# Patient Record
Sex: Female | Born: 1935 | Race: White | Hispanic: No | Smoking: Never smoker
Health system: Southern US, Community
[De-identification: ages and names within clinical notes are randomized; demographics above are authoritative.]

## PROBLEM LIST (undated history)

## (undated) DIAGNOSIS — I6789 Other cerebrovascular disease: Secondary | ICD-10-CM

## (undated) DIAGNOSIS — M1990 Unspecified osteoarthritis, unspecified site: Secondary | ICD-10-CM

## (undated) DIAGNOSIS — I10 Essential (primary) hypertension: Secondary | ICD-10-CM

## (undated) DIAGNOSIS — N390 Urinary tract infection, site not specified: Secondary | ICD-10-CM

## (undated) DIAGNOSIS — R339 Retention of urine, unspecified: Secondary | ICD-10-CM

## (undated) DIAGNOSIS — C801 Malignant (primary) neoplasm, unspecified: Secondary | ICD-10-CM

## (undated) DIAGNOSIS — I639 Cerebral infarction, unspecified: Secondary | ICD-10-CM

## (undated) HISTORY — PX: ABDOMINAL HYSTERECTOMY: SHX81

## (undated) HISTORY — PX: APPENDECTOMY: SHX54

## (undated) HISTORY — DX: Retention of urine, unspecified: R33.9

## (undated) HISTORY — DX: Other cerebrovascular disease: I67.89

## (undated) HISTORY — PX: TONSILLECTOMY: SUR1361

## (undated) HISTORY — DX: Urinary tract infection, site not specified: N39.0

---

## 2000-10-22 ENCOUNTER — Other Ambulatory Visit: Admission: RE | Admit: 2000-10-22 | Discharge: 2000-10-22 | Payer: Self-pay | Admitting: Dermatology

## 2001-04-25 ENCOUNTER — Encounter: Payer: Self-pay | Admitting: Family Medicine

## 2001-04-25 ENCOUNTER — Ambulatory Visit (HOSPITAL_COMMUNITY): Admission: RE | Admit: 2001-04-25 | Discharge: 2001-04-25 | Payer: Self-pay | Admitting: Family Medicine

## 2008-12-22 ENCOUNTER — Ambulatory Visit: Payer: Self-pay | Admitting: Gastroenterology

## 2008-12-22 ENCOUNTER — Inpatient Hospital Stay (HOSPITAL_COMMUNITY): Admission: EM | Admit: 2008-12-22 | Discharge: 2008-12-24 | Payer: Self-pay | Admitting: Emergency Medicine

## 2008-12-23 ENCOUNTER — Ambulatory Visit: Payer: Self-pay | Admitting: Internal Medicine

## 2008-12-30 ENCOUNTER — Telehealth: Payer: Self-pay | Admitting: Gastroenterology

## 2009-01-01 ENCOUNTER — Encounter: Payer: Self-pay | Admitting: Internal Medicine

## 2009-01-05 ENCOUNTER — Encounter: Payer: Self-pay | Admitting: Gastroenterology

## 2009-01-20 ENCOUNTER — Encounter: Payer: Self-pay | Admitting: Gastroenterology

## 2009-01-20 ENCOUNTER — Ambulatory Visit: Payer: Self-pay | Admitting: Gastroenterology

## 2009-01-20 ENCOUNTER — Ambulatory Visit (HOSPITAL_COMMUNITY): Admission: RE | Admit: 2009-01-20 | Discharge: 2009-01-20 | Payer: Self-pay | Admitting: Gastroenterology

## 2009-01-28 ENCOUNTER — Encounter: Payer: Self-pay | Admitting: Gastroenterology

## 2010-08-13 LAB — URINALYSIS, ROUTINE W REFLEX MICROSCOPIC
Bilirubin Urine: NEGATIVE
Nitrite: NEGATIVE
Protein, ur: NEGATIVE mg/dL

## 2010-08-13 LAB — CBC
HCT: 33 % — ABNORMAL LOW (ref 36.0–46.0)
HCT: 34.4 % — ABNORMAL LOW (ref 36.0–46.0)
HCT: 35.6 % — ABNORMAL LOW (ref 36.0–46.0)
HCT: 38.4 % (ref 36.0–46.0)
Hemoglobin: 12.3 g/dL (ref 12.0–15.0)
Hemoglobin: 13.2 g/dL (ref 12.0–15.0)
MCHC: 34.4 g/dL (ref 30.0–36.0)
MCHC: 34.8 g/dL (ref 30.0–36.0)
MCV: 95.7 fL (ref 78.0–100.0)
MCV: 96.4 fL (ref 78.0–100.0)
MCV: 96.6 fL (ref 78.0–100.0)
Platelets: 152 10*3/uL (ref 150–400)
Platelets: 163 10*3/uL (ref 150–400)
Platelets: 183 10*3/uL (ref 150–400)
Platelets: 206 10*3/uL (ref 150–400)
RBC: 3.98 MIL/uL (ref 3.87–5.11)
RDW: 13.9 % (ref 11.5–15.5)
RDW: 14.1 % (ref 11.5–15.5)
RDW: 14.3 % (ref 11.5–15.5)
WBC: 11.5 10*3/uL — ABNORMAL HIGH (ref 4.0–10.5)
WBC: 11.6 10*3/uL — ABNORMAL HIGH (ref 4.0–10.5)
WBC: 14.3 10*3/uL — ABNORMAL HIGH (ref 4.0–10.5)
WBC: 15.3 10*3/uL — ABNORMAL HIGH (ref 4.0–10.5)

## 2010-08-13 LAB — DIFFERENTIAL
Basophils Absolute: 0 10*3/uL (ref 0.0–0.1)
Basophils Absolute: 0 10*3/uL (ref 0.0–0.1)
Basophils Absolute: 0 10*3/uL (ref 0.0–0.1)
Basophils Absolute: 0 10*3/uL (ref 0.0–0.1)
Basophils Relative: 0 % (ref 0–1)
Basophils Relative: 0 % (ref 0–1)
Eosinophils Absolute: 0 10*3/uL (ref 0.0–0.7)
Eosinophils Absolute: 0.1 10*3/uL (ref 0.0–0.7)
Eosinophils Absolute: 0.3 10*3/uL (ref 0.0–0.7)
Eosinophils Relative: 0 % (ref 0–5)
Eosinophils Relative: 3 % (ref 0–5)
Lymphocytes Relative: 4 % — ABNORMAL LOW (ref 12–46)
Lymphs Abs: 1.7 10*3/uL (ref 0.7–4.0)
Monocytes Absolute: 0.5 10*3/uL (ref 0.1–1.0)
Neutro Abs: 13 10*3/uL — ABNORMAL HIGH (ref 1.7–7.7)
Neutrophils Relative %: 74 % (ref 43–77)
Neutrophils Relative %: 83 % — ABNORMAL HIGH (ref 43–77)
Neutrophils Relative %: 92 % — ABNORMAL HIGH (ref 43–77)

## 2010-08-13 LAB — COMPREHENSIVE METABOLIC PANEL
ALT: 16 U/L (ref 0–35)
Albumin: 3.5 g/dL (ref 3.5–5.2)
Alkaline Phosphatase: 39 U/L (ref 39–117)
BUN: 16 mg/dL (ref 6–23)
CO2: 24 mEq/L (ref 19–32)
Calcium: 8.3 mg/dL — ABNORMAL LOW (ref 8.4–10.5)
Chloride: 106 mEq/L (ref 96–112)
Creatinine, Ser: 0.84 mg/dL (ref 0.4–1.2)
Creatinine, Ser: 1.09 mg/dL (ref 0.4–1.2)
GFR calc Af Amer: >60 mL/min (ref >60)
GFR calc non Af Amer: 49 mL/min — ABNORMAL LOW (ref >60)
GFR calc non Af Amer: >60 mL/min (ref >60)
Glucose, Bld: 114 mg/dL — ABNORMAL HIGH (ref 70–99)
Glucose, Bld: 114 mg/dL — ABNORMAL HIGH (ref 70–99)
Potassium: 3.8 mEq/L (ref 3.5–5.1)
Potassium: 3.9 mEq/L (ref 3.5–5.1)
Total Bilirubin: 0.5 mg/dL (ref 0.3–1.2)
Total Bilirubin: 0.6 mg/dL (ref 0.3–1.2)

## 2010-08-13 LAB — MAGNESIUM: Magnesium: 1.9 mg/dL (ref 1.5–2.5)

## 2010-08-13 LAB — CLOSTRIDIUM DIFFICILE EIA

## 2010-08-13 LAB — BASIC METABOLIC PANEL
BUN: 19 mg/dL (ref 6–23)
BUN: 5 mg/dL — ABNORMAL LOW (ref 6–23)
CO2: 26 mEq/L (ref 19–32)
CO2: 30 mEq/L (ref 19–32)
Calcium: 9.7 mg/dL (ref 8.4–10.5)
Chloride: 106 mEq/L (ref 96–112)
Chloride: 113 mEq/L — ABNORMAL HIGH (ref 96–112)
Creatinine, Ser: 0.91 mg/dL (ref 0.4–1.2)
Creatinine, Ser: 1.33 mg/dL — ABNORMAL HIGH (ref 0.4–1.2)
GFR calc Af Amer: 47 mL/min — ABNORMAL LOW (ref >60)
GFR calc non Af Amer: 39 mL/min — ABNORMAL LOW (ref >60)
Glucose, Bld: 105 mg/dL — ABNORMAL HIGH (ref 70–99)
Glucose, Bld: 118 mg/dL — ABNORMAL HIGH (ref 70–99)
Potassium: 3.9 mEq/L (ref 3.5–5.1)
Sodium: 139 mEq/L (ref 135–145)

## 2010-08-13 LAB — LACTIC ACID, PLASMA: Lactic Acid, Venous: 1.3 mmol/L (ref 0.5–2.2)

## 2010-08-13 LAB — APTT
aPTT: 29 seconds (ref 24–37)
aPTT: 34 seconds (ref 24–37)

## 2010-08-13 LAB — PHOSPHORUS: Phosphorus: 2.2 mg/dL — ABNORMAL LOW (ref 2.3–4.6)

## 2010-08-13 LAB — OVA AND PARASITE EXAMINATION

## 2010-08-13 LAB — PROTIME-INR
INR: 1 (ref 0.00–1.49)
Prothrombin Time: 13.2 seconds (ref 11.6–15.2)

## 2010-08-13 LAB — URINE MICROSCOPIC-ADD ON

## 2010-09-20 NOTE — H&P (Signed)
[name], [name]               ACCOUNT NO.:  [id number]   MEDICAL RECORD NO.:  [id number]          PATIENT TYPE:  INP   LOCATION:  A211                          FACILITY:  [facility]   PHYSICIAN:  Margaretmary Dys, M.D.DATE OF BIRTH:  1929/08/02   DATE OF ADMISSION:  12/21/2008  DATE OF DISCHARGE:  LH                              HISTORY & PHYSICAL   PRIMARY CARE PHYSICIAN:  Kirk Ruths, M.D.   ADMISSION DIAGNOSES:  1. Acute abdominal pain.  2. Gastrointestinal bleeding, also acute  3. Colitis ischemic versus infectious.  4. History of hypertension, currently stable.  5. Acute renal failure with elevated creatinine of 1.33, likely      secondary to prerenal azotemia.   CHIEF COMPLAINT:  Bloody diarrhea of 1 day duration.   HISTORY OF PRESENT ILLNESS:  [name] is a 76 year old female who  presented to the emergency room after having multiple episodes of gross  bloody diarrhea at home.  The patient apparently said she went out to  eat lunch with her sister today.  After she got home, she began to have  diarrheal stools.  Initially, they were mostly brownish but subsequently  had become bloody.  She says she also had gone to the bathroom several  times and she felt lightheaded occasionally when she stood up.   She did not, however, have a syncopal episode.  She has no prior history  of GI bleed.  She has never had a colonoscopy.  She has no recent weight  loss.  She had been taking MiraLax for several days due to constipation  over the past 3 months.  However, she quit using that about 2 weeks ago  after she got what appeared to be relief.  She also has some nausea but  has had no vomiting.  No fevers or chills.  She denies any headaches,  but did have some lightheadedness after having several bowel episodes.  She also describes some abdominal pain, mostly periumbilical in origin.  It appears to be crampy in nature and was 8/10 at its worst and  temporarily relieved  with the diarrhea.  The patient was evaluated in  the emergency room and she did actually have one bowel movement in the  emergency room which was bloody.  The patient however was  hemodynamically stable, her H and H was stable.  She is not requiring  any emergency transfusion and her blood pressures were also stable.  The  patient is now being admitted for further evaluation.  I had requested a  CT scan of abdomen and pelvis which shows evidence of colitis; however,  due to her borderline renal function, contrast was not administered.  The patient is going to be kept n.p.o. and will request gastroenterology  input in the morning.   REVIEW OF SYSTEMS:  A 10-point review of systems otherwise negative  except as mentioned in history of present illness.   PAST MEDICAL HISTORY:  Hypertension.   PAST SURGICAL HISTORY:  1. Cesarean section.  2. Status post hysterectomy.   MEDICATIONS:  1. Lisinopril 1 tablet daily.  2. Aspirin  81 mg at bedtime.  3. Multivitamin, a women's formula, once a day.  4. Omega 3 fish orally once a day.  5. MiraLax which she stopped using last week.   ALLERGIES:  She has no known drug allergies.   FAMILY HISTORY:  Noncontributory.   SOCIAL HISTORY:  The patient does not smoke, does not drink alcohol.  No  history of illicit drug abuse.  She is very independent of activities of  daily living and continues to be very active.   PHYSICAL EXAMINATION:  The patient was conscious, alert, comfortable,  not in acute distress, well-oriented in time, place and person.  VITAL SIGNS: Blood pressure was 128/69, respiratory rate of 20, pulse  92, oxygen saturation was 100% on 2 liters nasal cannula.  Temperature  was 98.6 degrees Fahrenheit.  HEENT: Exam normocephalic, atraumatic.  Oral mucosa was dry.  No  exudates were noted.  NECK:  Supple.  No JVD or lymphadenopathy.  LUNGS:  Clear clinically with good air entry bilaterally.  HEART:  S1-S2 regular.  No S3, S4,  gallops or rubs.  ABDOMEN:  Soft, nontender, slightly obese but was not distended.  There  was no guarding  or rigidity. Bowel sounds were present and slightly  hyperactive.  RECTAL:  Exam showed gross bloody stools.  A digital rectal exam was not  performed.  EXTREMITIES:  No pitting pedal edema.  No calf induration or tenderness  was noted.  CNS:  Exam was grossly intact with no focal neurological deficits.   LABORATORY AND DIAGNOSTIC DATA:  White blood cell count was 11.6,  hemoglobin of 13, hematocrit was 38, platelet count was 206, neutrophils  of 88%.  The sodium is 139, potassium is 3.9, chloride 106, CO2 was 30,  glucose 118, BUN is 19, creatinine was 1.33, calcium was 9.7, lactic  acid was 1.3.  Urinalysis shows high specific gravity.  Urine microscopy  shows granular casts, 7-10 wbc's and only a few bacteria.  The stool for  Clostridium difficile toxin and cultures are pending.  CT scan of the  abdomen and pelvis shows evidence of long segment of colitis involving  the distal transverse and all of the descending colon.  Differential is  possible infectious colitis, including possible see Clostridium  difficile diarrhea, ischemic colitis or inflammatory bowel disease.  The  patient has some colonic diverticulosis without any evidence of  diverticulitis.   ASSESSMENT:  A 77 year old female presented to the emergency room due to  1-day history of bloody diarrhea.  She is hemodynamically stable.  CT  scan is suggestive of a colitis of uncertain etiology;  however, based  on her age, I suspect the patient has ischemic colitis. The symptoms are  also somewhat suggestive.   PLAN:  1. The patient will be admitted to the medical floor.  2. Will institute IV fluid therapy with normal saline at 75 mL an      hour.  3. Will monitor her vital signs very closely for any evidence of      worsening hypotension to suggest ongoing bleed.  4. Will also monitor hemoglobin and hematocrit.   5. Will request Dr. Jena Gauss, the gastroenterologist, to see the patient      in consult.  The patient may require colonoscopy with possible      biopsies.  6. I will put the patient empirically on ciprofloxacin and      metronidazole.  7. Will place the patient on sequential compression devices for DVT  prophylaxis.  8. Will type and cross blood if the patient has any more episodes of      bleeding here although her H and H is stable with no indication for      blood transfusion at this time.  9. Lactic acid level was normal.  10.Will hold blood pressure medications at this time and also her      aspirin will be put on hold.  11.Will place the patient on GI prophylaxis with Protonix q.12 hours.   I have discussed the above plan with the patient and also the fact that  she will require gastroenterology consult.  I also indicated that she  may also require surgery depending on the severity of her colitis.  I  also informed her that she may need blood transfusion.  The patient  verbalized full understanding.      Margaretmary Dys, M.D.  Electronically Signed     AM/MEDQ  D:  12/22/2008  T:  12/22/2008  Job:  [id number]   cc:   Kirk Ruths, M.D.  Fax: [phone]

## 2010-09-20 NOTE — Consult Note (Signed)
[name], [name]               ACCOUNT NO.:  [id number]   MEDICAL RECORD NO.:  [id number]          PATIENT TYPE:  INP   LOCATION:  A211                          FACILITY:  [facility]   PHYSICIAN:  Kassie Mends, M.D.      DATE OF BIRTH:  [birth year]   DATE OF CONSULTATION:  12/22/2008  DATE OF DISCHARGE:                                 CONSULTATION   REFERRING PHYSICIAN:  Dr. Rito Ehrlich.   REASON FOR CONSULTATION:  Diarrhea and hematochezia.   HISTORY OF PRESENT ILLNESS:  [name] is a 77 year old female who was  in her usual state of health until 2:30 p.m. yesterday.  She ate at  [facility].  It was the buffet.  She did consume beef.  She had no  chicken, fish or seafood.  She also had ham.  She had some mushrooms  that did not really taste good.  She had the onset of diarrhea and then  had some blood on the tissue.  Then she had frank bright red blood.  She  had 5-6 episodes of bright red blood per rectum.  She continues to have  bright red blood per rectum today.  She has some mild lower abdominal  cramping.  She does have an urge to have a bowel movement and then blood  comes out.  Prior to yesterday, she was having a problem with  constipation due to calcium.  She had stopped the calcium approximately  1 week ago.  She also takes a half aspirin at nighttime and also Aleve  once in the morning for the past month.  She takes the Aleve for aches  and pains.  She stopped the ibuprofen due to bad breath.  She has  never ever had these symptoms before.  She denies any fever or chills.   PAST MEDICAL HISTORY:  Hypertension.   PAST SURGICAL HISTORY:  1. Cesarean section x4.  2. Hysterectomy due to heavy menstrual bleeding in the 1970s.   ALLERGIES:  No known drug allergies.   MEDICATIONS:  1. Cipro.  2. Flagyl.  3. Protonix.  4. Morphine as needed.  5. Zofran as needed.  6. Phenergan as needed.   MEDICATIONS AT HOME:  1. Lisinopril.  2. Aspirin.  3. Multivitamin.  4.  Omega-3 fish oil.   FAMILY HISTORY:  She denies any family history of colon cancer or colon  polyps.   SOCIAL HISTORY:  She does not smoke or drink alcohol.  She continues to  be very active.  She is inquiring as to when she might be able to go  home.   REVIEW OF SYSTEMS:  As per the HPI.  Otherwise, all systems were  negative.   PHYSICAL EXAMINATION:  VITAL SIGNS:  T-max 98.6, systolic blood pressure  121 to 93, respiratory rate 18, heart rate 90, O2 saturation 98% on 2 L.  GENERAL:  She is in no apparent distress, alert and oriented x4.HEENT:  Atraumatic, normocephalic.  Pupils equal and reactive to light.  Mouth:  No oral lesions.  Posterior pharynx without erythema or exudate.NECK:  Full range of motion,  no lymphadenopathy.LUNGS:  Clear to auscultation  bilaterally.CARDIOVASCULAR:  Regular rhythm with no murmur.  No S1 and  S2.ABDOMEN:  Bowel sounds are present, soft, nondistended,  nontender.EXTREMITIES:  No cyanosis or edema.  NEUROLOGIC:  She has no focal neurologic deficits.   LABS:  White count 11.6 to 14.3, hemoglobin 13.2 to 12.3, platelets 183.  Creatinine 1.33 to 1.09.  Normal liver enzymes.  Lactic acid 1.3.  UA  showed few bacteria and 7-10 wbc's.  She has a pending C. difficile and  O and P.   RADIOGRAPHIC STUDIES:  CT scan of the abdomen without contrast showed  mild inflammation in the transverse and descending colon.  I personally  reviewed the CT scan with Dr. Juan Quam.  She has sigmoid colon  diverticulosis.  The SMA and IMA axis are patent.  This study is  somewhat limited due to the lack of contrast, but no atherosclerotic  plaques are noted of the SMA or the IMA axis.   ASSESSMENT:  [name] is a 77 year old female with the sudden onset  of diarrhea and hematochezia, followed by mild abdominal discomfort.  The differential diagnosis includes infectious colitis, inflammatory  bowel disease, and a low likelihood of nonsteroidal anti-inflammatory   drug colitis or ischemic colitis.  Thank you for allowing me to see Ms.  Symonds in consultation.  My recommendations follow.   RECOMMENDATIONS:  1. Continue Cipro and Flagyl for 10 days.  2. Full liquid diet.  3. If the patient continues to improve, then schedule colonoscopy as      an outpatient.  If she continues to have rectal bleeding in spite      of antibiotics, then she will need a colonoscopy on Thursday.   ADDENDUM 21308:  TCS 91510: Hyperplastic polyp. Repeat in 10 years.      Kassie Mends, M.D.  Electronically Signed     SM/MEDQ  D:  12/23/2008  T:  12/23/2008  Job:  [id number]   cc:   Kirk Ruths, M.D.  Fax: [phone]

## 2010-09-20 NOTE — Discharge Summary (Signed)
[name], [name]               ACCOUNT NO.:  [id number]   MEDICAL RECORD NO.:  [id number]          PATIENT TYPE:  INP   LOCATION:  A211                          FACILITY:  [facility]   PHYSICIAN:  Melissa L. Ladona Ridgel, MD  DATE OF BIRTH:  11-12-1929   DATE OF ADMISSION:  12/21/2008  DATE OF DISCHARGE:  08/19/2010LH                               DISCHARGE SUMMARY   DISCHARGE DIAGNOSES:  1. Colitis, likely ischemic.  2. Gastrointestinal bleed secondary to colitis.  3. Mild anemia.  4. Hypertension.   CONSULTS:  Gastroenterology.  Dr. Kassie Mends saw [name] on December 22, 2008.  Dr. Ulyses Southward impression was that this is a 77 year old  Caucasian female with sudden onset diarrhea and hematochezia.  Differential included infectious colitis, inflammatory bowel disease or  ischemic colitis.  Dr. Cira Servant recommended continuing Cipro and Flagyl for  10 days and to schedule a colonoscopy as an outpatient unless she  continued to have rectal bleeding, then she would have a colonoscopy as  an inpatient on Thursday or Friday.   PERTINENT DIAGNOSTIC:  The patient had a CT of her abdomen and pelvis  done December 22, 2008.  She had no acute abnormality in her abdomen.  In  her pelvis she was found to have a long segment of colitis involving the  distal transverse and all of the descending colon.  She also had colonic  diverticulosis without diverticulitis.   HISTORY AND BRIEF HOSPITAL COURSE:  [name] is a very pleasant 77-  year-old Caucasian female who presented to the [facility] emergency  department after having multiple episodes of diarrhea with bright red  blood per rectum at home.  She tells me that she had been severely  constipated for several months.  She felt this was due to taking  calcium.  She also admitted to taking Aleve regularly for aches and  pains as well as a daily 81 mg aspirin.  Further she mentioned she had  never had a colonoscopy.  The patient was found to have  borderline renal  function with an elevated creatinine.  She was admitted to the hospital,  placed on IV fluids, a GI consult was called.  Over the course of 24-36  hours, the patient's bowel movements decreased in number.  They went  from having red blood to being brown, however at the time of discharge  they are still liquid in nature.  She was placed on clear liquids  initially, now she is able to tolerate a full liquid diet.  She was  started on IV Cipro and Flagyl.  She will continue these as an  outpatient in p.o. form for a full 10-day course.  She is without pain  this morning.  She is able to ambulate around without any assistance.  She is having brown bowel movements that are liquid, but she states she  is ready to go home.  She is being discharged to home in good condition.   PHYSICAL EXAMINATION:  VITAL SIGNS:  This morning vital signs are  temperature 98, pulse 81, respirations 12, blood pressure is 112/67.  GENERAL:  She is a 77 year old overweight, pleasant Caucasian female in  no apparent distress.  HEENT:  Her head is normocephalic, atraumatic.  Her eyes are anicteric  with conjunctivae that are pink.  Sclerae that are clear.  Mouth and  nose have moist mucous membranes without exterior lesions or discharge.  NECK:  Her neck is supple with midline trachea.  She has no apparent JVD  or lymphadenopathy.  CHEST:  No accessory muscle use.  She has no wheezes or crackles to my  auscultation.  CARDIOVASCULAR:  Her heart has regular rate and rhythm with no murmurs,  rubs or gallops appreciated.  ABDOMEN:  Mildly obese, but soft, nontender, nondistended with good  bowel sounds.  EXTREMITIES:  Lower extremities are warm without cyanosis or edema.  She  does have increased vascularity and telangiectasia in her lower  extremities and she has had varicose veins removed in the past.  Her  dorsalis pedis pulses are 2+ bilaterally.  PSYCHIATRICALLY:  [name] long and  short-term memory are intact.  She is in the cheerful mood.  She is alert, oriented and aware of her  colonoscopy that is scheduled for next week.   LABORATORY DATA:  Labs today are significant for a white count of 11.5,  hemoglobin 11.5, hematocrit 33, platelets 152,000.  Coags were within  normal limits during this hospital stay.  B-MET shows a sodium 141,  potassium 3.7, chloride 113, bicarb 26, glucose 105, BUN 5, creatinine  0.91, calcium 8.2, however, she also has a serum albumin that is 2.9.  Consequently her serum calcium is probably within normal range.  Her  urinalysis was noted to have 7-10 white blood cells per high power  field.  She was C. diff negative and her ova and parasites stool  cultures were negative.   DISPOSITION:  She is being discharged to home in good condition.   DISCHARGE MEDICATIONS:  1. Lisinopril 5 mg 1 pill daily.  2. Fish oil 1 pill daily.  3. Multivitamin 1 pill daily.  4. B6 vitamin 1 pill daily.  5. 81 mg aspirin 1 pill daily.  6. MiraLax 17 grams daily in 4-6 ounces of clear liquid.  Addendum:  Please note that the patient was sent home with perscriptions  for Cipro 500 PO BID and Flagyl 500 PO BID for the remaining 7 of 10  total days.   INSTRUCTIONS:  She is to avoid NSAIDs, specifically Aleve and take  MiraLax daily.  She will take Tylenol for pain.  She has an appointment  for a colonoscopy with Dr. Roetta Sessions within the next week.  I will  ask her to follow up with Dr. Regino Schultze within the next month.  Approximately 45 minutes was spent on this discharge summary.    Addendum: Patient was seen and examined prior to discharge.  She stated  she had no stools over night and was tolerating her diet which was still  full liquids.  She reported no abdominal pain or nausea.  I agree with  the above exam and reviewed the plan for discharge and approve.   Melissa L. Ladona Ridgel, M.D.      Stephani Police, PA      Melissa L. Ladona Ridgel, MD   Electronically Signed    MLY/MEDQ  D:  12/24/2008  T:  12/24/2008  Job:  [id number]   cc:   R. Roetta Sessions, M.D.  [address]  [place]  [place] [address]   Kirk Ruths, M.D.  Fax: [phone]

## 2010-09-20 NOTE — Group Therapy Note (Signed)
[name], [name]               ACCOUNT NO.:  [id number]   MEDICAL RECORD NO.:  [id number]          PATIENT TYPE:  INP   LOCATION:  A211                          FACILITY:  [facility]   PHYSICIAN:  Melissa L. Ladona Ridgel, MD  DATE OF BIRTH:  1930-04-10   DATE OF PROCEDURE:  12/23/2008  DATE OF DISCHARGE:                                 PROGRESS NOTE   SUBJECTIVE:  The patient is lying in bed, eating her dinner.  She is  tolerating a full liquid diet.  She states she still had some small  loose stools today but they are not as copious nor as frequent as they  have been.  She denies abdominal pain, nausea or vomiting.  She  expressed to me that she did speak with Dr. Jena Gauss today and remembers  him saying something about deciding whether her current symptoms  reflected ischemic colitis versus infectious colitis.  She is looking  forward to possibly discharging to home soon and was able to tell me  that Dr. Jena Gauss had recommended a colonoscopy, she just was  not sure  when.   VITAL SIGNS:  T-max was 99.9.  Today she is 97.9, blood pressure 116/55,  pulse 89, respirations 20, saturation 97%.  The total documented on the  17th was five episodes and today I only have one documented.  Her urine  output appears to be adequate. Evidently has put out at least 500 of  urine over the course of the morning with multiple other voids.   GENERAL:  This is a well-developed, well-nourished, white female in no  acute distress.  HEENT:  She is normocephalic, atraumatic.  Pupils  equal, round, reactive to light.  Extraocular muscles intact.  Mucous  membranes are moist.  She has anicteric sclerae.  There are no  periocular lesions.  NECK:  Supple.  There is no JVD.  No lymph nodes.  No carotid bruits.  CHEST:  Clear to auscultation.  There is no rhonchi,  rales or wheezes.  CARDIOVASCULAR:  Regular rate and rhythm.  Positive  S1, S2.  No S3 or S4.  No murmurs, rubs, or gallops.  ABDOMEN:  Soft,  still a little  bit distended but nontender, no guarding or rebound at  present.  She has positive bowel sounds.  EXTREMITIES:  Show no  clubbing, cyanosis or edema.  2+ radial pulses, 2+ posterior tibial  pulses.  NEUROLOGIC:  Cranial nerves II-XII appear to be intact, power  is 5/5, DTRs are 2+, plantars are downgoing.  Sensation appears to be  grossly intact.   Pertinent laboratories today reveal an negative ova and parasite study,  a C diff toxin negative.  Phosphorus is 2.2, magnesium is 1.9.  Her  sodium is 140, potassium 3.8, chloride 113, CO2 24, BUN 7, creatinine  0.84, glucose of 114, PTT was 34.  PT was 14.3 with an INR of 1.1, white  count was 15.3 with hemoglobin of 11.9, hematocrit 34.4 and platelets of  163.   I have reviewed her medication list.  She is currently on Cipro 400 mg  IV q.12  and metronidazole 500 mg IV q.12 hours in addition to her  Prinivil 5 mg p.o. daily.   ASSESSMENT AND PLAN:  This is a 77 year old white female presented to  the hospital with abdominal pain, some gastrointestinal bleeding and  multiple episodes of stools.  The patient stated she became lightheaded  over the course of the morning after going out with her sister and  states that she also had some nausea but no vomiting.  The patient came  to the emergency room.  She was feeling 8/10 crampy abdominal pain.  CT  scan of the abdomen was obtained which showed thickening in the  pericolic area with a tiny amount of thickening in the pericolic  gutters.  They were having difficulty assessing whether there was  actually some mural thickening because she did not have p.o. contrast.  The impression, however, was a long segment of colitis involving the  distal transverse colon and all of the descending colon.  The  differential included an infectious colitis but they could not  completely rule out an ischemic colitis.  The patient also had  diverticulosis without diverticulitis.   1. Colitis.  Unclear whether  this is ischemic versus infectious.  But      at this time it appears that the infectious process has not been      identified.  The patient has been maintained on a lower dose blood      pressure medication and has been IV fluid rehydrated.  She has also      been treated with Cipro and Flagyl and seems to be doing quite      well.  She is tolerating a full liquid diet.  I will review the      case with Dr. Jena Gauss in the morning as I do not see his comments      today.  The patient had a low-grade temperature yesterday.  If she      is afebrile over the course of this evening she may be a candidate      for outpatient therapy with a follow-up colonoscopy as recommended      by Dr. Jena Gauss.  2. Hypertension.  Currently hemodynamically stable on low-dose ACE      inhibitor.  We may, if she has other low blood pressures, elect to      discontinue that and allow her to perfuse adequately.  3. Acute renal failure with a creatinine of 1.33.  This was felt to be      prerenal.  Currently her creatinine has returned to normal at 0.84.  4. Deep vein thrombosis prophylaxis.  The patient is currently on      mechanical venous thromboembolism prophylaxis because of rectal      bleeding.  5. At this time we will follow gastrointestinal bleed.  In terms of      discharge planning will reevaluate her early in the  morning and if      she is clinically stable and can follow up as an outpatient we will      do so.  Total time with this case is less than 30 minutes.      Melissa L. Ladona Ridgel, MD  Electronically Signed     MLT/MEDQ  D:  12/23/2008  T:  12/23/2008  Job:  [id number]

## 2010-09-20 NOTE — Group Therapy Note (Signed)
NAMECLARISSIA, MCKEEN               ACCOUNT NO.:  [id number]   MEDICAL RECORD NO.:  [id number]          PATIENT TYPE:  INP   LOCATION:  A211                          FACILITY:  [facility]   PHYSICIAN:  R. Roetta Sessions, M.D. DATE OF BIRTH:  [birth year]   DATE OF PROCEDURE:  12/23/2008  DATE OF DISCHARGE:                                 PROGRESS NOTE   Followup bloody diarrhea.  Picture consistent with colitis on CT scan.  Currently the paper chart is not available for review.   Ms. Utke tells me she is feeling some better today.  She is having  less abdominal cramps.  She is still having some intermittent blood per  rectum.  She has remained afebrile.  Temperature 98, pulse 67,  respiratory rate 20, BP 97/45.  White count today 15,300, H and H 11.9  and 34.4 down from 12.3 and 35.6.  She continues on IV Cipro and Flagyl.  C. diff toxin assay has come back negative.   OBJECTIVE:  She appears comfortable.  She is tolerating, appears to be  tolerating clear liquid lunch.  Abdomen nondistended.  Positive bowel  sounds.  She has some mild diffuse tenderness to palpation without  appreciable mass or hepatosplenomegaly.   ASSESSMENT:  Acute onset abdominal cramps, diarrhea, blood per rectum,  colitis on CT.  This is in a setting of nonsteroidal agent use.  The  scenario sounds a bit more like ischemic or segmental colitis to me than  an infectious process.  However, certainly an infectious process remains  very much in the differential.  This acute illness is superimposed on  chronic NSAID use which may be magnifying the clinical picture somewhat.   RECOMMENDATIONS:  1. Would finish out a 10 day course of Cipro and Flagyl.  2. Observe her closely over the next 24 hours.  Repeat H and H      tomorrow morning.  At this point she may be allowed to recover from      this acute illness and return as an outpatient for a diagnostic      colonoscopy to further evaluate acute illness and particularly  the      rectal bleeding.      Jonathon Bellows, M.D.  Electronically Signed     RMR/MEDQ  D:  12/23/2008  T:  12/23/2008  Job:  [id number]

## 2011-07-24 ENCOUNTER — Encounter (HOSPITAL_COMMUNITY): Payer: Self-pay | Admitting: Pharmacy Technician

## 2011-07-26 ENCOUNTER — Encounter (HOSPITAL_COMMUNITY): Payer: Self-pay

## 2011-07-26 ENCOUNTER — Encounter (HOSPITAL_COMMUNITY)
Admission: RE | Admit: 2011-07-26 | Discharge: 2011-07-26 | Disposition: A | Discharge status: Home / Self Care | Payer: Medicare Other | Source: Ambulatory Visit | Attending: Ophthalmology | Admitting: Ophthalmology

## 2011-07-26 ENCOUNTER — Other Ambulatory Visit: Payer: Self-pay

## 2011-07-26 HISTORY — DX: Essential (primary) hypertension: I10

## 2011-07-26 HISTORY — DX: Malignant (primary) neoplasm, unspecified: C80.1

## 2011-07-26 HISTORY — DX: Unspecified osteoarthritis, unspecified site: M19.90

## 2011-07-26 LAB — BASIC METABOLIC PANEL
BUN: 11 mg/dL (ref 6–23)
CO2: 27 mEq/L (ref 19–32)
Calcium: 9.8 mg/dL (ref 8.4–10.5)
Chloride: 102 mEq/L (ref 96–112)
Creatinine, Ser: 1.03 mg/dL (ref 0.50–1.10)

## 2011-07-26 LAB — HEMOGLOBIN AND HEMATOCRIT, BLOOD: Hemoglobin: 13.4 g/dL (ref 12.0–15.0)

## 2011-07-26 NOTE — Patient Instructions (Signed)
20 Carrina J Sterba  07/26/2011   Your procedure is scheduled on:  Monday, 07/31/11  Report to [facility] at 0700 AM.  Call this number if you have problems the morning of surgery: [phone]   Remember:   Do not eat food:After Midnight.  May have clear liquids:until Midnight .  Clear liquids include soda, tea, black coffee, apple or grape juice, broth.  Take these medicines the morning of surgery with A SIP OF WATER: lisinopril   Do not wear jewelry, make-up or nail polish.  Do not wear lotions, powders, or perfumes. You may wear deodorant.  Do not shave 48 hours prior to surgery.  Do not bring valuables to the hospital.  Contacts, dentures or bridgework may not be worn into surgery.  Leave suitcase in the car. After surgery it may be brought to your room.  For patients admitted to the hospital, checkout time is 11:00 AM the day of discharge.   Patients discharged the day of surgery will not be allowed to drive home.  Name and phone number of your driver: driver  Special Instructions: Use eye drops as instructed.   Please read over the following fact sheets that you were given: Pain Booklet, Anesthesia Post-op Instructions and Care and Recovery After Surgery   Cataract A cataract is a clouding of the lens of the eye. When a lens becomes cloudy, vision is reduced based on the degree and nature of the clouding. Many cataracts reduce vision to some degree. Some cataracts make people more near-sighted as they develop. Other cataracts increase glare. Cataracts that are ignored and become worse can sometimes look white. The white color can be seen through the pupil. CAUSES   Aging. However, cataracts may occur at any age, even in newborns.   Certain drugs.   Trauma to the eye.   Certain diseases such as diabetes.   Specific eye diseases such as chronic inflammation inside the eye or a sudden attack of a rare form of glaucoma.   Inherited or acquired medical problems.  SYMPTOMS    Gradual, progressive drop in vision in the affected eye.   Severe, rapid visual loss. This most often happens when trauma is the cause.  DIAGNOSIS  To detect a cataract, an eye doctor examines the lens. Cataracts are best diagnosed with an exam of the eyes with the pupils enlarged (dilated) by drops.  TREATMENT  For an early cataract, vision may improve by using different eyeglasses or stronger lighting. If that does not help your vision, surgery is the only effective treatment. A cataract needs to be surgically removed when vision loss interferes with your everyday activities, such as driving, reading, or watching TV. A cataract may also have to be removed if it prevents examination or treatment of another eye problem. Surgery removes the cloudy lens and usually replaces it with a substitute lens (intraocular lens, IOL).  At a time when both you and your doctor agree, the cataract will be surgically removed. If you have cataracts in both eyes, only one is usually removed at a time. This allows the operated eye to heal and be out of danger from any possible problems after surgery (such as infection or poor wound healing). In rare cases, a cataract may be doing damage to your eye. In these cases, your caregiver may advise surgical removal right away. The vast majority of people who have cataract surgery have better vision afterward. HOME CARE INSTRUCTIONS  If you are not planning surgery, you may be  asked to do the following:  Use different eyeglasses.   Use stronger or brighter lighting.   Ask your eye doctor about reducing your medicine dose or changing medicines if it is thought that a medicine caused your cataract. Changing medicines does not make the cataract go away on its own.   Become familiar with your surroundings. Poor vision can lead to injury. Avoid bumping into things on the affected side. You are at a higher risk for tripping or falling.   Exercise extreme care when driving or  operating machinery.   Wear sunglasses if you are sensitive to bright light or experiencing problems with glare.  SEEK IMMEDIATE MEDICAL CARE IF:   You have a worsening or sudden vision loss.   You notice redness, swelling, or increasing pain in the eye.   You have a fever.  Document Released: 04/24/2005 Document Revised: 04/13/2011 Document Reviewed: 12/16/2010 [facility] Patient Information 2012 [place], [place].

## 2011-07-28 MED ORDER — TETRACAINE HCL 0.5 % OP SOLN
OPHTHALMIC | Status: AC
  Filled 2011-07-28: qty 2

## 2011-07-28 MED ORDER — NEOMYCIN-POLYMYXIN-DEXAMETH 3.5-10000-0.1 OP OINT
TOPICAL_OINTMENT | OPHTHALMIC | Status: AC
  Filled 2011-07-28: qty 3.5

## 2011-07-28 MED ORDER — LIDOCAINE HCL 3.5 % OP GEL
OPHTHALMIC | Status: AC
  Filled 2011-07-28: qty 5

## 2011-07-28 MED ORDER — CYCLOPENTOLATE-PHENYLEPHRINE 0.2-1 % OP SOLN
OPHTHALMIC | Status: AC
  Filled 2011-07-28: qty 2

## 2011-07-28 MED ORDER — LIDOCAINE HCL (PF) 1 % IJ SOLN
INTRAMUSCULAR | Status: AC
  Filled 2011-07-28: qty 2

## 2011-07-31 ENCOUNTER — Encounter (HOSPITAL_COMMUNITY)
Admission: RE | Disposition: A | Discharge status: Home / Self Care | Payer: Self-pay | Source: Ambulatory Visit | Attending: Ophthalmology

## 2011-07-31 ENCOUNTER — Encounter (HOSPITAL_COMMUNITY): Payer: Self-pay | Admitting: Anesthesiology

## 2011-07-31 ENCOUNTER — Ambulatory Visit (HOSPITAL_COMMUNITY)
Admission: RE | Admit: 2011-07-31 | Discharge: 2011-07-31 | Disposition: A | Discharge status: Home / Self Care | Payer: Medicare Other | Source: Ambulatory Visit | Attending: Ophthalmology | Admitting: Ophthalmology

## 2011-07-31 ENCOUNTER — Encounter (HOSPITAL_COMMUNITY): Payer: Self-pay | Admitting: Ophthalmology

## 2011-07-31 ENCOUNTER — Ambulatory Visit (HOSPITAL_COMMUNITY): Payer: Medicare Other | Admitting: Anesthesiology

## 2011-07-31 DIAGNOSIS — Z01810 Encounter for preprocedural cardiovascular examination: Secondary | ICD-10-CM | POA: Insufficient documentation

## 2011-07-31 DIAGNOSIS — I10 Essential (primary) hypertension: Secondary | ICD-10-CM | POA: Insufficient documentation

## 2011-07-31 DIAGNOSIS — Z79899 Other long term (current) drug therapy: Secondary | ICD-10-CM | POA: Insufficient documentation

## 2011-07-31 DIAGNOSIS — Z01812 Encounter for preprocedural laboratory examination: Secondary | ICD-10-CM | POA: Insufficient documentation

## 2011-07-31 DIAGNOSIS — H2589 Other age-related cataract: Secondary | ICD-10-CM | POA: Insufficient documentation

## 2011-07-31 HISTORY — PX: CATARACT EXTRACTION W/PHACO: SHX586

## 2011-07-31 SURGERY — PHACOEMULSIFICATION, CATARACT, WITH IOL INSERTION
Anesthesia: Monitor Anesthesia Care | Site: Eye | Laterality: Right | Wound class: Clean

## 2011-07-31 MED ORDER — CYCLOPENTOLATE-PHENYLEPHRINE 0.2-1 % OP SOLN
OPHTHALMIC | Status: AC
  Administered 2011-07-31: 1 [drp] via OPHTHALMIC
  Filled 2011-07-31: qty 2

## 2011-07-31 MED ORDER — LIDOCAINE 3.5 % OP GEL OPTIME - NO CHARGE
OPHTHALMIC | Status: DC | PRN
  Administered 2011-07-31: 1 [drp] via OPHTHALMIC

## 2011-07-31 MED ORDER — NEOMYCIN-POLYMYXIN-DEXAMETH 0.1 % OP OINT
TOPICAL_OINTMENT | OPHTHALMIC | Status: DC | PRN
  Administered 2011-07-31: 1 via OPHTHALMIC

## 2011-07-31 MED ORDER — CYCLOPENTOLATE-PHENYLEPHRINE 0.2-1 % OP SOLN
1.0000 [drp] | OPHTHALMIC | Status: AC
  Administered 2011-07-31 (×3): 1 [drp] via OPHTHALMIC

## 2011-07-31 MED ORDER — PHENYLEPHRINE HCL 2.5 % OP SOLN
OPHTHALMIC | Status: AC
  Administered 2011-07-31: 1 [drp] via OPHTHALMIC
  Filled 2011-07-31: qty 2

## 2011-07-31 MED ORDER — EPINEPHRINE HCL 1 MG/ML IJ SOLN
INTRAMUSCULAR | Status: AC
  Filled 2011-07-31: qty 1

## 2011-07-31 MED ORDER — MIDAZOLAM HCL 2 MG/2ML IJ SOLN
INTRAMUSCULAR | Status: AC
  Administered 2011-07-31: 2 mg via INTRAVENOUS
  Filled 2011-07-31: qty 2

## 2011-07-31 MED ORDER — EPINEPHRINE HCL 1 MG/ML IJ SOLN
INTRAOCULAR | Status: DC | PRN
  Administered 2011-07-31: 08:00:00

## 2011-07-31 MED ORDER — BSS IO SOLN
INTRAOCULAR | Status: DC | PRN
  Administered 2011-07-31: 15 mL via INTRAOCULAR

## 2011-07-31 MED ORDER — TETRACAINE HCL 0.5 % OP SOLN
1.0000 [drp] | OPHTHALMIC | Status: AC
  Administered 2011-07-31 (×3): 1 [drp] via OPHTHALMIC

## 2011-07-31 MED ORDER — LACTATED RINGERS IV SOLN
INTRAVENOUS | Status: DC
  Administered 2011-07-31: 08:00:00 via INTRAVENOUS

## 2011-07-31 MED ORDER — LIDOCAINE HCL (PF) 1 % IJ SOLN
INTRAMUSCULAR | Status: AC
  Filled 2011-07-31: qty 2

## 2011-07-31 MED ORDER — LIDOCAINE HCL 3.5 % OP GEL
OPHTHALMIC | Status: AC
  Administered 2011-07-31: 1 via OPHTHALMIC
  Filled 2011-07-31: qty 5

## 2011-07-31 MED ORDER — PHENYLEPHRINE HCL 2.5 % OP SOLN
1.0000 [drp] | OPHTHALMIC | Status: AC
  Administered 2011-07-31 (×3): 1 [drp] via OPHTHALMIC

## 2011-07-31 MED ORDER — PROVISC 10 MG/ML IO SOLN
INTRAOCULAR | Status: DC | PRN
  Administered 2011-07-31: .85 mL via INTRAOCULAR

## 2011-07-31 MED ORDER — LIDOCAINE HCL 3.5 % OP GEL
1.0000 "application " | Freq: Once | OPHTHALMIC | Status: AC
  Administered 2011-07-31: 1 via OPHTHALMIC

## 2011-07-31 MED ORDER — LIDOCAINE HCL (PF) 1 % IJ SOLN
INTRAMUSCULAR | Status: DC | PRN
  Administered 2011-07-31: .4 mL

## 2011-07-31 MED ORDER — MIDAZOLAM HCL 2 MG/2ML IJ SOLN
1.0000 mg | INTRAMUSCULAR | Status: DC | PRN
  Administered 2011-07-31: 2 mg via INTRAVENOUS

## 2011-07-31 MED ORDER — NEOMYCIN-POLYMYXIN-DEXAMETH 3.5-10000-0.1 OP OINT
TOPICAL_OINTMENT | OPHTHALMIC | Status: AC
  Filled 2011-07-31: qty 3.5

## 2011-07-31 MED ORDER — ONDANSETRON HCL 4 MG/2ML IJ SOLN: 4.0000 mg | Freq: Once | INTRAMUSCULAR | Status: DC | PRN

## 2011-07-31 MED ORDER — POVIDONE-IODINE 5 % OP SOLN
OPHTHALMIC | Status: DC | PRN
  Administered 2011-07-31: 1 via OPHTHALMIC

## 2011-07-31 MED ORDER — FENTANYL CITRATE 0.05 MG/ML IJ SOLN: 25.0000 ug | INTRAMUSCULAR | Status: DC | PRN

## 2011-07-31 SURGICAL SUPPLY — 32 items
CAPSULAR TENSION RING-AMO (OPHTHALMIC RELATED) IMPLANT
CLOTH BEACON ORANGE TIMEOUT ST (SAFETY) ×1 IMPLANT
EYE SHIELD UNIVERSAL CLEAR (GAUZE/BANDAGES/DRESSINGS) ×1 IMPLANT
GLOVE BIO SURGEON STRL SZ 6.5 (GLOVE) IMPLANT
GLOVE BIOGEL PI IND STRL 6.5 (GLOVE) IMPLANT
GLOVE BIOGEL PI IND STRL 7.0 (GLOVE) IMPLANT
GLOVE BIOGEL PI IND STRL 7.5 (GLOVE) IMPLANT
GLOVE BIOGEL PI INDICATOR 6.5 (GLOVE) ×1
GLOVE BIOGEL PI INDICATOR 7.0 (GLOVE)
GLOVE BIOGEL PI INDICATOR 7.5 (GLOVE)
GLOVE ECLIPSE 6.5 STRL STRAW (GLOVE) IMPLANT
GLOVE ECLIPSE 7.0 STRL STRAW (GLOVE) IMPLANT
GLOVE ECLIPSE 7.5 STRL STRAW (GLOVE) IMPLANT
GLOVE EXAM NITRILE LRG STRL (GLOVE) IMPLANT
GLOVE EXAM NITRILE MD LF STRL (GLOVE) ×2 IMPLANT
GLOVE SKINSENSE NS SZ6.5 (GLOVE)
GLOVE SKINSENSE NS SZ7.0 (GLOVE)
GLOVE SKINSENSE STRL SZ6.5 (GLOVE) IMPLANT
GLOVE SKINSENSE STRL SZ7.0 (GLOVE) IMPLANT
KIT VITRECTOMY (OPHTHALMIC RELATED) IMPLANT
LENS INTRAOCULAR RESTOR ×1 IMPLANT
PAD ARMBOARD 7.5X6 YLW CONV (MISCELLANEOUS) ×1 IMPLANT
PROC W NO LENS (INTRAOCULAR LENS)
PROC W SPEC LENS (INTRAOCULAR LENS) ×2
PROCESS W NO LENS (INTRAOCULAR LENS) IMPLANT
PROCESS W SPEC LENS (INTRAOCULAR LENS) IMPLANT
RING MALYGIN (MISCELLANEOUS) IMPLANT
SYR TB 1ML LL NO SAFETY (SYRINGE) ×1 IMPLANT
TAPE SURG TRANSPORE 1 IN (GAUZE/BANDAGES/DRESSINGS) IMPLANT
TAPE SURGICAL TRANSPORE 1 IN (GAUZE/BANDAGES/DRESSINGS) ×1
VISCOELASTIC ADDITIONAL (OPHTHALMIC RELATED) IMPLANT
WATER STERILE IRR 250ML POUR (IV SOLUTION) ×1 IMPLANT

## 2011-07-31 NOTE — Op Note (Signed)
NAMEKENDEL, PESNELL               ACCOUNT NO.:  [id number]  MEDICAL RECORD NO.:  [id number]  LOCATION:  APPO                          FACILITY:  [facility]  PHYSICIAN:  Susanne Greenhouse, MD       DATE OF BIRTH:  [birth year]  DATE OF PROCEDURE:  07/31/2011 DATE OF DISCHARGE:                              OPERATIVE REPORT   PREOPERATIVE DIAGNOSIS:  Combined cataract, right eye, diagnosis code 366.19.  POSTOPERATIVE DIAGNOSIS:  Combined cataract, right eye, diagnosis code 366.19.  OPERATION PERFORMED:  Phacoemulsification with posterior chamber intraocular lens implantation, right eye.  SURGEON:  Bonne Dolores. Majour Frei, MD  ANESTHESIA:  General endotracheal anesthesia.  OPERATIVE SUMMARY:  In the preoperative area, dilating drops were placed into the right eye.  The patient was then brought into the operating room where she was placed under general anesthesia.  The eye was then prepped and draped.  Beginning with a 75 blade, a paracentesis port was made at the surgeon's 2 o'clock position.  The anterior chamber was then filled with a 1% nonpreserved lidocaine solution with epinephrine.  This was followed by Viscoat to deepen the chamber.  A small fornix-based peritomy was performed superiorly.  Next, a single iris hook was placed through the limbus superiorly.  A 2.4-mm keratome blade was then used to make a clear corneal incision over the iris hook.  A bent cystotome needle and Utrata forceps were used to create a continuous tear capsulotomy.  Hydrodissection was performed using balanced salt solution on a fine cannula.  The lens nucleus was then removed using phacoemulsification in a quadrant cracking technique.  The cortical material was then removed with irrigation and aspiration.  The capsular bag and anterior chamber were refilled with Provisc.  The wound was widened to approximately 3 mm and a posterior chamber intraocular lens was placed into the capsular bag without difficulty using an  [employer] lens injecting system.  A single 10-0 nylon suture was then used to close the incision as well as stromal hydration.  The Provisc was removed from the anterior chamber and capsular bag with irrigation and aspiration.  At this point, the wounds were tested for leak, which were negative.  The anterior chamber remained deep and stable.  The patient tolerated the procedure well.  There were no operative complications, and she awoke from general anesthesia without problem.  No surgical specimens.  Prosthetic device used is a Alcon AcrySof Restor, posterior chamber lens, model SN6AD1, power of 23.0, serial number is [id number].          ______________________________ Susanne Greenhouse, MD     KEH/MEDQ  D:  07/31/2011  T:  07/31/2011  Job:  [id number]

## 2011-07-31 NOTE — Discharge Instructions (Signed)
[name]  07/31/2011     Instructions  1. Use medications as Instructed.  Shake well before use. Wait 5 minutes between drops.  {OPHTHALMIC ANTIBIOTICS:22167} 4 times a day x 1 week.  {OPHTHALMIC ANTI-INFLAMMATORY:22168} 2 times a day x 4 weeks.  {OPHTHALMIC STEROID:22169} 4 times a day - week 1   3 times a day - Week 2, 2 times a day- Week 3, 1 time a day - Week 4.  2. Do not rub the operative eye. Do not swim underwater for 2 weeks.  3. You may remove the clear shield and resume your normal activities the day after  Surgery. Your eyes may feel more comfortable if you wear dark glasses outside.  4. Call our office at [phone] if you have sudden change in vision, extreme redness or pain. Some fluctuation in vision is normal after surgery. If you have an emergency after hours, call Dr. Alto Denver at [phone].  5. It is important that you attend all of your follow-up appointments.        Follow-up:{follow up:32580} with Gemma Payor, MD.   Dr. Lahoma Crocker: [phone]  Dr. Lita Mains: [phone]  Dr. Alto Denver: [phone]   If you find that you cannot contact your physician, but feel that your signs and   Symptoms warrant a physician's attention, call the Emergency Room at   [phone].   Other{NA AND IHKVQQVZ:56387}.

## 2011-07-31 NOTE — Anesthesia Procedure Notes (Signed)
Procedure Name: MAC Date/Time: 07/31/2011 8:20 AM Performed by: Franco Nones Pre-anesthesia Checklist: Patient identified, Emergency Drugs available, Suction available, Timeout performed and Patient being monitored Patient Re-evaluated:Patient Re-evaluated prior to inductionOxygen Delivery Method: Nasal Cannula

## 2011-07-31 NOTE — Anesthesia Preprocedure Evaluation (Signed)
Anesthesia Evaluation  Patient identified by MRN, date of birth, ID band Patient awake    Reviewed: Allergy & Precautions, H&P , NPO status , Patient's Chart, lab work & pertinent test results  History of Anesthesia Complications Negative for: history of anesthetic complications  Airway Mallampati: III      Dental  (+) Partial Lower   Pulmonary neg pulmonary ROS,  breath sounds clear to auscultation        Cardiovascular hypertension, Pt. on medications Rhythm:Regular Rate:Normal     Neuro/Psych    GI/Hepatic   Endo/Other    Renal/GU      Musculoskeletal   Abdominal   Peds  Hematology   Anesthesia Other Findings   Reproductive/Obstetrics                           Anesthesia Physical Anesthesia Plan  ASA: II  Anesthesia Plan: MAC   Post-op Pain Management:    Induction: Intravenous  Airway Management Planned: Nasal Cannula  Additional Equipment:   Intra-op Plan:   Post-operative Plan:   Informed Consent: I have reviewed the patients History and Physical, chart, labs and discussed the procedure including the risks, benefits and alternatives for the proposed anesthesia with the patient or authorized representative who has indicated his/her understanding and acceptance.     Plan Discussed with:   Anesthesia Plan Comments:         Anesthesia Quick Evaluation

## 2011-07-31 NOTE — Transfer of Care (Signed)
Immediate Anesthesia Transfer of Care Note  Patient: [name]  Procedure(s) Performed: Procedure(s) (LRB): CATARACT EXTRACTION PHACO AND INTRAOCULAR LENS PLACEMENT (IOC) (Right)  Patient Location: Shortstay  Anesthesia Type: MAC  Level of Consciousness: awake  Airway & Oxygen Therapy: Patient Spontanous Breathing   Post-op Assessment: Report given to PACU RN, Post -op Vital signs reviewed and stable and Patient moving all extremities  Post vital signs: Reviewed and stable  Complications: No apparent anesthesia complications

## 2011-07-31 NOTE — H&P (Signed)
I have reviewed the H&P, the patient was re-examined, and I have identified no interval changes in medical condition and plan of care since the history and physical of record  

## 2011-07-31 NOTE — Anesthesia Postprocedure Evaluation (Signed)
  Anesthesia Post-op Note  Patient: [name]  Procedure(s) Performed: Procedure(s) (LRB): CATARACT EXTRACTION PHACO AND INTRAOCULAR LENS PLACEMENT (IOC) (Right)  Patient Location:  Short Stay  Anesthesia Type: MAC  Level of Consciousness: awake  Airway and Oxygen Therapy: Patient Spontanous Breathing  Post-op Pain: none  Post-op Assessment: Post-op Vital signs reviewed, Patient's Cardiovascular Status Stable, Respiratory Function Stable, Patent Airway, No signs of Nausea or vomiting and Pain level controlled  Post-op Vital Signs: Reviewed and stable  Complications: No apparent anesthesia complications

## 2011-07-31 NOTE — Brief Op Note (Signed)
Pre-Op Dx: Cataract OD Post-Op Dx: Cataract OD Surgeon: Lerry Cordrey Anesthesia: Topical with MAC Implant: Alcon ReStor [id number] Blood Loss: None Specimen: None Complications: None 

## 2011-08-02 ENCOUNTER — Encounter (HOSPITAL_COMMUNITY): Payer: Self-pay | Admitting: Ophthalmology

## 2011-08-08 ENCOUNTER — Encounter (HOSPITAL_COMMUNITY)
Admission: RE | Admit: 2011-08-08 | Discharge: 2011-08-08 | Payer: Medicare Other | Source: Ambulatory Visit | Admitting: Ophthalmology

## 2011-08-08 ENCOUNTER — Other Ambulatory Visit (HOSPITAL_COMMUNITY): Payer: Self-pay | Admitting: Internal Medicine

## 2011-08-08 DIAGNOSIS — M79609 Pain in unspecified limb: Secondary | ICD-10-CM

## 2011-08-08 MED ORDER — TETRACAINE HCL 0.5 % OP SOLN: 1.0000 [drp] | OPHTHALMIC | Status: DC

## 2011-08-08 MED ORDER — PHENYLEPHRINE HCL 2.5 % OP SOLN: 1.0000 [drp] | OPHTHALMIC | Status: DC

## 2011-08-08 MED ORDER — CYCLOPENTOLATE-PHENYLEPHRINE 0.2-1 % OP SOLN: 1.0000 [drp] | OPHTHALMIC | Status: DC

## 2011-08-08 MED ORDER — LIDOCAINE HCL 3.5 % OP GEL: 1.0000 "application " | Freq: Once | OPHTHALMIC | Status: DC

## 2011-08-11 MED ORDER — LIDOCAINE HCL (PF) 1 % IJ SOLN
INTRAMUSCULAR | Status: AC
  Filled 2011-08-11: qty 2

## 2011-08-11 MED ORDER — TETRACAINE HCL 0.5 % OP SOLN
OPHTHALMIC | Status: AC
  Filled 2011-08-11: qty 2

## 2011-08-11 MED ORDER — NEOMYCIN-POLYMYXIN-DEXAMETH 3.5-10000-0.1 OP OINT
TOPICAL_OINTMENT | OPHTHALMIC | Status: AC
  Filled 2011-08-11: qty 3.5

## 2011-08-11 MED ORDER — LIDOCAINE HCL 3.5 % OP GEL
OPHTHALMIC | Status: AC
  Filled 2011-08-11: qty 5

## 2011-08-11 MED ORDER — PHENYLEPHRINE HCL 2.5 % OP SOLN
OPHTHALMIC | Status: AC
  Filled 2011-08-11: qty 2

## 2011-08-11 MED ORDER — CYCLOPENTOLATE-PHENYLEPHRINE 0.2-1 % OP SOLN
OPHTHALMIC | Status: AC
  Filled 2011-08-11: qty 2

## 2011-08-14 ENCOUNTER — Ambulatory Visit (HOSPITAL_COMMUNITY): Payer: Medicare Other | Admitting: Anesthesiology

## 2011-08-14 ENCOUNTER — Ambulatory Visit (HOSPITAL_COMMUNITY)
Admission: RE | Admit: 2011-08-14 | Discharge: 2011-08-14 | Disposition: A | Discharge status: Home / Self Care | Payer: Medicare Other | Source: Ambulatory Visit | Attending: Ophthalmology | Admitting: Ophthalmology

## 2011-08-14 ENCOUNTER — Encounter (HOSPITAL_COMMUNITY)
Admission: RE | Disposition: A | Discharge status: Home / Self Care | Payer: Self-pay | Source: Ambulatory Visit | Attending: Ophthalmology

## 2011-08-14 ENCOUNTER — Encounter (HOSPITAL_COMMUNITY): Payer: Self-pay | Admitting: *Deleted

## 2011-08-14 ENCOUNTER — Encounter (HOSPITAL_COMMUNITY): Payer: Self-pay | Admitting: Anesthesiology

## 2011-08-14 ENCOUNTER — Other Ambulatory Visit (HOSPITAL_COMMUNITY): Payer: Medicare Other

## 2011-08-14 DIAGNOSIS — H52209 Unspecified astigmatism, unspecified eye: Secondary | ICD-10-CM | POA: Insufficient documentation

## 2011-08-14 DIAGNOSIS — H268 Other specified cataract: Secondary | ICD-10-CM | POA: Insufficient documentation

## 2011-08-14 DIAGNOSIS — E039 Hypothyroidism, unspecified: Secondary | ICD-10-CM | POA: Insufficient documentation

## 2011-08-14 DIAGNOSIS — I10 Essential (primary) hypertension: Secondary | ICD-10-CM | POA: Insufficient documentation

## 2011-08-14 HISTORY — PX: CATARACT EXTRACTION W/PHACO: SHX586

## 2011-08-14 SURGERY — PHACOEMULSIFICATION, CATARACT, WITH IOL INSERTION
Anesthesia: Monitor Anesthesia Care | Site: Eye | Laterality: Left | Wound class: Clean

## 2011-08-14 MED ORDER — PROVISC 10 MG/ML IO SOLN
INTRAOCULAR | Status: DC | PRN
  Administered 2011-08-14: 8.5 mg via INTRAOCULAR

## 2011-08-14 MED ORDER — LIDOCAINE HCL 3.5 % OP GEL: OPHTHALMIC | Status: AC

## 2011-08-14 MED ORDER — LIDOCAINE HCL (PF) 1 % IJ SOLN
INTRAMUSCULAR | Status: DC | PRN
  Administered 2011-08-14: .4 mL

## 2011-08-14 MED ORDER — TETRACAINE HCL 0.5 % OP SOLN
1.0000 [drp] | OPHTHALMIC | Status: AC
  Administered 2011-08-14 (×3): 1 [drp] via OPHTHALMIC

## 2011-08-14 MED ORDER — LIDOCAINE 3.5 % OP GEL OPTIME - NO CHARGE
OPHTHALMIC | Status: DC | PRN
  Administered 2011-08-14: 1 [drp] via OPHTHALMIC

## 2011-08-14 MED ORDER — CYCLOPENTOLATE-PHENYLEPHRINE 0.2-1 % OP SOLN
1.0000 [drp] | OPHTHALMIC | Status: AC
  Administered 2011-08-14 (×3): 1 [drp] via OPHTHALMIC

## 2011-08-14 MED ORDER — EPINEPHRINE HCL 1 MG/ML IJ SOLN
INTRAMUSCULAR | Status: AC
  Filled 2011-08-14: qty 1

## 2011-08-14 MED ORDER — FENTANYL CITRATE 0.05 MG/ML IJ SOLN: 25.0000 ug | INTRAMUSCULAR | Status: DC | PRN

## 2011-08-14 MED ORDER — BSS IO SOLN
INTRAOCULAR | Status: DC | PRN
  Administered 2011-08-14: 15 mL via INTRAOCULAR

## 2011-08-14 MED ORDER — EPINEPHRINE HCL 1 MG/ML IJ SOLN
INTRAOCULAR | Status: DC | PRN
  Administered 2011-08-14: 09:00:00

## 2011-08-14 MED ORDER — MIDAZOLAM HCL 2 MG/2ML IJ SOLN
INTRAMUSCULAR | Status: AC
  Filled 2011-08-14: qty 2

## 2011-08-14 MED ORDER — PHENYLEPHRINE HCL 2.5 % OP SOLN
1.0000 [drp] | OPHTHALMIC | Status: AC
  Administered 2011-08-14 (×3): 1 [drp] via OPHTHALMIC

## 2011-08-14 MED ORDER — POVIDONE-IODINE 5 % OP SOLN
OPHTHALMIC | Status: DC | PRN
  Administered 2011-08-14: 1 via OPHTHALMIC

## 2011-08-14 MED ORDER — MIDAZOLAM HCL 2 MG/2ML IJ SOLN
1.0000 mg | INTRAMUSCULAR | Status: DC | PRN
Start: 2011-08-14 — End: 2011-08-14
  Administered 2011-08-14: 2 mg via INTRAVENOUS

## 2011-08-14 MED ORDER — ONDANSETRON HCL 4 MG/2ML IJ SOLN: 4.0000 mg | Freq: Once | INTRAMUSCULAR | Status: DC | PRN

## 2011-08-14 MED ORDER — LACTATED RINGERS IV SOLN
INTRAVENOUS | Status: DC
  Administered 2011-08-14: 1000 mL via INTRAVENOUS

## 2011-08-14 SURGICAL SUPPLY — 31 items
CAPSULAR TENSION RING-AMO (OPHTHALMIC RELATED) IMPLANT
CLOTH BEACON ORANGE TIMEOUT ST (SAFETY) ×1 IMPLANT
EYE SHIELD UNIVERSAL CLEAR (GAUZE/BANDAGES/DRESSINGS) ×2 IMPLANT
GLOVE BIO SURGEON STRL SZ 6.5 (GLOVE) IMPLANT
GLOVE BIOGEL PI IND STRL 6.5 (GLOVE) IMPLANT
GLOVE BIOGEL PI IND STRL 7.0 (GLOVE) IMPLANT
GLOVE BIOGEL PI IND STRL 7.5 (GLOVE) IMPLANT
GLOVE BIOGEL PI INDICATOR 6.5 (GLOVE) ×1
GLOVE BIOGEL PI INDICATOR 7.0 (GLOVE)
GLOVE BIOGEL PI INDICATOR 7.5 (GLOVE)
GLOVE ECLIPSE 6.5 STRL STRAW (GLOVE) IMPLANT
GLOVE ECLIPSE 7.0 STRL STRAW (GLOVE) IMPLANT
GLOVE ECLIPSE 7.5 STRL STRAW (GLOVE) IMPLANT
GLOVE EXAM NITRILE LRG STRL (GLOVE) IMPLANT
GLOVE EXAM NITRILE MD LF STRL (GLOVE) ×1 IMPLANT
GLOVE SKINSENSE NS SZ6.5 (GLOVE)
GLOVE SKINSENSE NS SZ7.0 (GLOVE)
GLOVE SKINSENSE STRL SZ6.5 (GLOVE) IMPLANT
GLOVE SKINSENSE STRL SZ7.0 (GLOVE) IMPLANT
KIT VITRECTOMY (OPHTHALMIC RELATED) IMPLANT
LENS INTRAOCULAR RESTOR ×2 IMPLANT
PAD ARMBOARD 7.5X6 YLW CONV (MISCELLANEOUS) ×1 IMPLANT
PROC W NO LENS (INTRAOCULAR LENS)
PROC W SPEC LENS (INTRAOCULAR LENS) ×2
PROCESS W NO LENS (INTRAOCULAR LENS) IMPLANT
PROCESS W SPEC LENS (INTRAOCULAR LENS) ×1 IMPLANT
RING MALYGIN (MISCELLANEOUS) IMPLANT
SYR TB 1ML LL NO SAFETY (SYRINGE) ×1 IMPLANT
TAPE CLOTH SOFT 2X10 (GAUZE/BANDAGES/DRESSINGS) ×1 IMPLANT
VISCOELASTIC ADDITIONAL (OPHTHALMIC RELATED) IMPLANT
WATER STERILE IRR 250ML POUR (IV SOLUTION) ×1 IMPLANT

## 2011-08-14 NOTE — Anesthesia Postprocedure Evaluation (Signed)
  Anesthesia Post-op Note  Patient: [name]  Procedure(s) Performed: Procedure(s) (LRB): CATARACT EXTRACTION PHACO AND INTRAOCULAR LENS PLACEMENT (IOC) (Left)  Patient Location: PACU and Short Stay  Anesthesia Type: MAC  Level of Consciousness: awake, alert , oriented and patient cooperative  Airway and Oxygen Therapy: Patient Spontanous Breathing  Post-op Pain: none  Post-op Assessment: Post-op Vital signs reviewed, Patient's Cardiovascular Status Stable, Respiratory Function Stable, Patent Airway and No signs of Nausea or vomiting  Post-op Vital Signs: Reviewed and stable  Complications: No apparent anesthesia complications

## 2011-08-14 NOTE — Brief Op Note (Signed)
Pt wants to keep partials in mouth   Anesthesia to be notified

## 2011-08-14 NOTE — Brief Op Note (Signed)
Pre-Op Dx: Cataract OS Post-Op Dx: Cataract OS Surgeon: Gemma Payor Anesthesia: Topical with MAC Proc: CE/IOL/PLRI OS Implant: Alcon ReStor [id number] Specimen: None Complications: None

## 2011-08-14 NOTE — Op Note (Signed)
NAMEJOSEFITA, [name]               ACCOUNT NO.:  [id number]  MEDICAL RECORD NO.:  [id number]  LOCATION:  APPO                          FACILITY:  [facility]  PHYSICIAN:  Susanne Greenhouse, MD       DATE OF BIRTH:  1929/07/03  DATE OF PROCEDURE:  08/14/2011 DATE OF DISCHARGE:  08/14/2011                              OPERATIVE REPORT   PREOPERATIVE DIAGNOSES:  Combined cataract, left eye, diagnosis code 366.19 and stigmatism, diagnosis code 367.21.  POSTOPERATIVE DIAGNOSES:  Combined cataract, left eye, diagnosis code 366.19 and stigmatism, diagnosis code 367.21.  SURGEON:  Susanne Greenhouse, MD.  ANESTHESIA:  Topical with monitored anesthesia care and IV sedation.  DESCRIPTION OF THE OPERATION:  In the preoperative holding area, dilating drops and viscous lidocaine were placed into the left eye.  The patient was then brought to the operating room where she was prepped and draped.  Beginning with a #75 blade, a paracentesis port was made at the surgeon's 2 o'clock position.  Anterior chamber was then filled with a 1% nonpreserved lidocaine solution.  This was followed by filling anterior chamber with Provisc.  A 2.4 mm keratome blade was then used to make a clear corneal incision at the superior limbus.  A bent cystotome needle was used to create a continuous tear capsulotomy. Hydrodissection was performed with balanced salt solution on a fine cannula.  The lens nucleus was then removed using phacoemulsification in a quadrant cracking technique.  Residual cortex was removed with irrigation and aspiration.  The capsular bag and anterior chamber were refilled with Provisc.  Posterior chamber intraocular lens was placed into the capsular bag without difficulty using [employer] lens injecting system.  A matching penetrating limbal incision was made with the 2.4 mm keratome blade at the inferior limbus.  The Provisc was then removed from the capsular bag and anterior chamber with irrigation  and aspiration.  Stromal hydration of the main incision and paracentesis ports were performed with balanced salt solution and a fine cannula. The wounds were tested for leak which were negative.  The patient tolerated the procedure well.  There were no operative complications and she was returned to the recovery area in satisfactory condition.  No surgical specimens.  Prosthetic device used is a Alcon AcrySof ReSTOR, posterior chamber lens, model SN6AD1, power of 22.0, serial number is [id number]. Combined cataract, left eye, diagnosis code 366.19 and stigmatism, diagnosis code 367.21.          ______________________________ Susanne Greenhouse, MD     KEH/MEDQ  D:  08/14/2011  T:  08/14/2011  Job:  [id number]

## 2011-08-14 NOTE — Discharge Instructions (Signed)
[name]  08/14/2011     Instructions  1. Use medications as Instructed.  Shake well before use. Wait 5 minutes between drops.  {OPHTHALMIC ANTIBIOTICS:22167} 4 times a day x 1 week.  {OPHTHALMIC ANTI-INFLAMMATORY:22168} 2 times a day x 4 weeks.  {OPHTHALMIC STEROID:22169} 4 times a day - week 1   3 times a day - Week 2, 2 times a day- Week 3, 1 time a day - Week 4.  2. Do not rub the operative eye. Do not swim underwater for 2 weeks.  3. You may remove the clear shield and resume your normal activities the day after  Surgery. Your eyes may feel more comfortable if you wear dark glasses outside.  4. Call our office at [phone] if you have sudden change in vision, extreme redness or pain. Some fluctuation in vision is normal after surgery. If you have an emergency after hours, call Dr. Alto Denver at [phone].  5. It is important that you attend all of your follow-up appointments.        Follow-up:{follow up:32580} with Gemma Payor, MD.   Dr. Lahoma Crocker: [phone]  Dr. Lita Mains: [phone]  Dr. Alto Denver: [phone]   If you find that you cannot contact your physician, but feel that your signs and   Symptoms warrant a physician's attention, call the Emergency Room at   [phone].   Other{NA AND [id number]:10272}.

## 2011-08-14 NOTE — Anesthesia Preprocedure Evaluation (Signed)
Anesthesia Evaluation  Patient identified by MRN, date of birth, ID band Patient awake    Reviewed: Allergy & Precautions, H&P , NPO status , Patient's Chart, lab work & pertinent test results  History of Anesthesia Complications Negative for: history of anesthetic complications  Airway Mallampati: III      Dental  (+) Partial Lower   Pulmonary neg pulmonary ROS,  breath sounds clear to auscultation        Cardiovascular hypertension, Pt. on medications Rhythm:Regular Rate:Normal     Neuro/Psych    GI/Hepatic   Endo/Other  Hypothyroidism   Renal/GU      Musculoskeletal   Abdominal   Peds  Hematology   Anesthesia Other Findings   Reproductive/Obstetrics                           Anesthesia Physical Anesthesia Plan  ASA: II  Anesthesia Plan: MAC   Post-op Pain Management:    Induction: Intravenous  Airway Management Planned: Nasal Cannula  Additional Equipment:   Intra-op Plan:   Post-operative Plan:   Informed Consent: I have reviewed the patients History and Physical, chart, labs and discussed the procedure including the risks, benefits and alternatives for the proposed anesthesia with the patient or authorized representative who has indicated his/her understanding and acceptance.     Plan Discussed with:   Anesthesia Plan Comments:         Anesthesia Quick Evaluation

## 2011-08-14 NOTE — Transfer of Care (Signed)
Immediate Anesthesia Transfer of Care Note  Patient: [name]  Procedure(s) Performed: Procedure(s) (LRB): CATARACT EXTRACTION PHACO AND INTRAOCULAR LENS PLACEMENT (IOC) (Left)  Patient Location: PACU and Short Stay  Anesthesia Type: MAC  Level of Consciousness: awake, alert , oriented and patient cooperative  Airway & Oxygen Therapy: Patient Spontanous Breathing  Post-op Assessment: Report given to PACU RN, Post -op Vital signs reviewed and stable and Patient moving all extremities  Post vital signs: Reviewed and stable  Complications: No apparent anesthesia complications

## 2011-08-14 NOTE — H&P (Signed)
I have reviewed the H&P, the patient was re-examined, and I have identified no interval changes in medical condition and plan of care since the history and physical of record  

## 2011-08-15 MED ORDER — NEOMYCIN-POLYMYXIN-DEXAMETH 0.1 % OP OINT
TOPICAL_OINTMENT | OPHTHALMIC | Status: AC | PRN
  Administered 2011-08-15: 1 via OPHTHALMIC

## 2011-08-16 ENCOUNTER — Encounter (HOSPITAL_COMMUNITY): Payer: Self-pay | Admitting: Ophthalmology

## 2011-08-17 ENCOUNTER — Ambulatory Visit (HOSPITAL_COMMUNITY)
Admission: RE | Admit: 2011-08-17 | Discharge: 2011-08-17 | Disposition: A | Discharge status: Home / Self Care | Payer: Medicare Other | Source: Ambulatory Visit | Attending: Internal Medicine | Admitting: Internal Medicine

## 2011-08-17 DIAGNOSIS — Z780 Asymptomatic menopausal state: Secondary | ICD-10-CM | POA: Insufficient documentation

## 2011-08-17 DIAGNOSIS — Z90710 Acquired absence of both cervix and uterus: Secondary | ICD-10-CM | POA: Insufficient documentation

## 2011-08-17 DIAGNOSIS — M79609 Pain in unspecified limb: Secondary | ICD-10-CM | POA: Insufficient documentation

## 2012-09-10 ENCOUNTER — Ambulatory Visit (INDEPENDENT_AMBULATORY_CARE_PROVIDER_SITE_OTHER): Payer: Medicare Other | Admitting: Orthopedic Surgery

## 2012-09-10 ENCOUNTER — Encounter: Payer: Self-pay | Admitting: Orthopedic Surgery

## 2012-09-10 ENCOUNTER — Ambulatory Visit (INDEPENDENT_AMBULATORY_CARE_PROVIDER_SITE_OTHER): Payer: Medicare Other

## 2012-09-10 VITALS — BP 140/62 | Ht 67.0 in | Wt 183.0 lb

## 2012-09-10 DIAGNOSIS — M75100 Unspecified rotator cuff tear or rupture of unspecified shoulder, not specified as traumatic: Secondary | ICD-10-CM

## 2012-09-10 DIAGNOSIS — M25519 Pain in unspecified shoulder: Secondary | ICD-10-CM

## 2012-09-10 DIAGNOSIS — M25512 Pain in left shoulder: Secondary | ICD-10-CM

## 2012-09-10 DIAGNOSIS — M719 Bursopathy, unspecified: Secondary | ICD-10-CM

## 2012-09-10 NOTE — Patient Instructions (Signed)
You have received a steroid shot. 15% of patients experience increased pain at the injection site with in the next 24 hours. This is best treated with ice and tylenol extra strength 2 tabs every 8 hours. If you are still having pain please call the office.   CALL TO ARRANGE THERAPY

## 2012-09-10 NOTE — Progress Notes (Signed)
Subjective:    Patient ID: [name], female    DOB: 12/17/1929, 79 y.o.   MRN: [id number]  Shoulder Pain  The pain is present in the neck, left shoulder and right shoulder. This is a new problem. The current episode started more than 1 month ago. There has been no history of extremity trauma. The problem occurs intermittently. The problem has been waxing and waning. The quality of the pain is described as pounding, burning and sharp. The pain is at a severity of 10/10. Associated symptoms include an inability to bear weight, a limited range of motion and stiffness. Pertinent negatives include no fever, itching, joint locking, joint swelling, numbness or tingling. Associated symptoms comments: Locking and catching, crepitance cervical spine pain behind the legs secondary to straining to get out of a chair.      Review of Systems  Constitutional: Negative for fever.  Gastrointestinal: Positive for constipation.  Musculoskeletal: Positive for stiffness.  Skin: Positive for rash. Negative for itching.  Neurological: Negative for tingling and numbness.  All other systems reviewed and are negative.       Objective:   Physical Exam  Vitals reviewed. Constitutional: She is oriented to person, place, and time. She appears well-developed and well-nourished. No distress.  HENT:  Head: Normocephalic and atraumatic.  Eyes: Conjunctivae are normal.  Neck:  Cervical spine tenderness none midline, mild increased tension and tenderness. Cervical periscapular. Skin normal range of motion decreased flexion extension rotation with pain  Cardiovascular: Intact distal pulses.   Musculoskeletal:       Right shoulder: She exhibits decreased range of motion, tenderness, pain and decreased strength. She exhibits no bony tenderness, no swelling, no effusion, no crepitus, no deformity, no spasm and normal pulse.       Left shoulder: She exhibits decreased range of motion, tenderness, crepitus, pain and  decreased strength. She exhibits no bony tenderness, no swelling, no effusion, no deformity, no laceration and normal pulse.       Arms: Neurological: She is alert and oriented to person, place, and time. She has normal reflexes. She exhibits abnormal muscle tone.  Increased muscle tone in both trapezii  Skin: Skin is warm and dry. She is not diaphoretic.  Psychiatric: She has a normal mood and affect. Her behavior is normal. Judgment and thought content normal.    Both shoulders are stable drop test normal bilateral  We took an x-ray of the left shoulder to evaluate for glenohumeral arthritis it was normal  Lower extremity exam  Ambulation is normal.  The right and left lower extremity:  Inspection and palpation revealed no  abnormality in alignment in the lower extremities. Range of motion is full.  Strength is grade 5.  All joints are stable.        Assessment & Plan:  Bilateral shoulder impingement syndrome  Bilateral shoulder injection  Physical therapy return 1 month  Shoulder Injection Procedure Note   Pre-operative Diagnosis: right  RC Syndrome  Post-operative Diagnosis: same  Indications: pain   Anesthesia: ethyl chloride   Procedure Details   Verbal consent was obtained for the procedure. The shoulder was prepped withalcohol and the skin was anesthetized. A 20 gauge needle was advanced into the subacromial space through posterior approach without difficulty  The space was then injected with 3 ml 1% lidocaine and 1 ml of depomedrol. The injection site was cleansed with isopropyl alcohol and a dressing was applied.  Complications:  None; patient tolerated the procedure well.  Subacromial Shoulder  Injection Procedure Note  Pre-operative Diagnosis: left RC Syndrome  Post-operative Diagnosis: same  Indications: pain   Anesthesia: ethyl chloride   Procedure Details   Verbal consent was obtained for the procedure. The shoulder was prepped withalcohol  and the skin was anesthetized. A 20 gauge needle was advanced into the subacromial space through posterior approach without difficulty  The space was then injected with 3 ml 1% lidocaine and 1 ml of depomedrol. The injection site was cleansed with isopropyl alcohol and a dressing was applied.  Complications:  None; patient tolerated the procedure well.

## 2012-09-12 ENCOUNTER — Ambulatory Visit (HOSPITAL_COMMUNITY)
Admission: RE | Admit: 2012-09-12 | Discharge: 2012-09-12 | Disposition: A | Discharge status: Home / Self Care | Payer: Medicare Other | Source: Ambulatory Visit | Attending: Orthopedic Surgery | Admitting: Orthopedic Surgery

## 2012-09-12 DIAGNOSIS — M6281 Muscle weakness (generalized): Secondary | ICD-10-CM | POA: Insufficient documentation

## 2012-09-12 DIAGNOSIS — M25619 Stiffness of unspecified shoulder, not elsewhere classified: Secondary | ICD-10-CM | POA: Insufficient documentation

## 2012-09-12 DIAGNOSIS — M25519 Pain in unspecified shoulder: Secondary | ICD-10-CM | POA: Insufficient documentation

## 2012-09-12 DIAGNOSIS — IMO0001 Reserved for inherently not codable concepts without codable children: Secondary | ICD-10-CM | POA: Insufficient documentation

## 2012-09-12 NOTE — Evaluation (Signed)
Occupational Therapy Evaluation  Patient Details  Name: [name] MRN: [id number] Date of Birth: Feb 24, 1930  Today's Date: 09/12/2012 Time: 4098-1191 OT Time Calculation (min): 40 min OT eval [phone]'  Visit#: 1 of 12  Re-eval: 10/10/12  Assessment Diagnosis: Bilateral rotator cuff syndrome Next MD Visit: 10/15/12 Dr. Romeo Apple Prior Therapy: [facility] outpatient ([facility]?)  Authorization: BCBS Medicare  Authorization Time Period: before 10th visit  Authorization Visit#: 1 of 10   Past Medical History:  Past Medical History  Diagnosis Date  . Hypertension   . Arthritis   . Cancer     melanoma of nose   Past Surgical History:  Past Surgical History  Procedure Laterality Date  . Abdominal hysterectomy      1982  . Cesarean section      x4  . Tonsillectomy      age 22  . Appendectomy      with 1 c-section  . Cataract extraction w/phaco  07/31/2011    Procedure: CATARACT EXTRACTION PHACO AND INTRAOCULAR LENS PLACEMENT (IOC);  Surgeon: Gemma Payor, MD;  Location: [facility];  Service: Ophthalmology;  Laterality: Right;  CDE: 11.31  . Cataract extraction w/phaco  08/14/2011    Procedure: CATARACT EXTRACTION PHACO AND INTRAOCULAR LENS PLACEMENT (IOC);  Surgeon: Gemma Payor, MD;  Location: [facility];  Service: Ophthalmology;  Laterality: Left;  CDE: 10.25    Subjective Symptoms/Limitations Symptoms: S: I want to be able to use my arms like I did before. Pertinent History: Patient began to experience pain in bilateral shoulder in February which is causing difficulty completing daily activities. Patient received steroid shots in bilateral shoulder which has lessened the pain slightly. Dr. Romeo Apple has referred patient to occupational therapy for evaluation and treatment.  Limitations: reaching up high, sleeping in her bed, neck pain and stiffness Special Tests: DASH score of 50 with ideal score of 0. Patient Stated Goals: To use arms as before.  Pain Assessment Currently in Pain?:  Yes Pain Score:   8 Pain Location: Shoulder Pain Orientation: Left;Right Pain Type: Acute pain Pain Frequency: Constant  Precautions/Restrictions  Precautions Precautions: None  Balance Screening Balance Screen Has the patient fallen in the past 6 months: No  Prior Functioning  Home Living Lives With:  (sister ) Prior Function Level of Independence: Independent with basic ADLs;Independent with gait Able to Take Stairs?: Yes Driving: Yes Vocation: Retired Leisure: Hobbies-no  Assessment ADL/Vision/Perception ADL ADL Comments: Patient having difficulty with donning shirt, reaching up above head or out to the side, housecleaning Dominant Hand: Right Vision - History Baseline Vision: No visual deficits  Cognition/Observation Cognition Overall Cognitive Status: Within Functional Limits for tasks assessed Arousal/Alertness: Awake/alert Orientation Level: Oriented X4  Sensation/Coordination/Edema    Additional Assessments RUE AROM (degrees) RUE Overall AROM Comments: assessed seated. IR/ER ABD Right Shoulder Flexion: 104 Degrees Right Shoulder ABduction: 83 Degrees Right Shoulder Internal Rotation: 90 Degrees Right Shoulder External Rotation: 88 Degrees RUE Strength Right Shoulder Flexion: 4/5 Right Shoulder ABduction:  (4-/5) Right Shoulder Internal Rotation:  (4-/5) Right Shoulder External Rotation:  (4-/5) LUE AROM (degrees) Left Shoulder Flexion: 102 Degrees Left Shoulder ABduction: 66 Degrees Left Shoulder Internal Rotation: 90 Degrees Left Shoulder External Rotation: 71 Degrees LUE Strength Left Shoulder Flexion: 4/5 Left Shoulder ABduction:  (4-/5) Left Shoulder Internal Rotation:  (4-/5) Left Shoulder External Rotation:  (4-/5) Palpation Palpation: Mod fascial restrictions and muscle tightness in cervical region, bilateral scapularis and trapezius.     Occupational Therapy Assessment and Plan OT Assessment and  Plan Clinical Impression Statement:  Patient is a 79 year old female with increased pain and decreased mobility in bilateral shoulders and cervical region causing decreased I with desired daily activities. Skilled OT intervention is indicated to increase pain free mobility and strength and decrease pain and fascial restricitons in bilateral shoulders and cervical region. Pt will benefit from skilled therapeutic intervention in order to improve on the following deficits: Impaired UE functional use;Increased fascial restricitons;Decreased activity tolerance;Decreased strength;Pain Rehab Potential: Excellent OT Frequency: Min 2X/week OT Duration: 6 weeks OT Treatment/Interventions: Self-care/ADL training;Therapeutic activities;Therapeutic exercise;Manual therapy;Modalities;Patient/family education OT Plan: P: Skilled OT intervention is indicated to increase pain free mobility and strength and decrease pain and fascial restricitons in bilateral shoulders in order to complete all desired acitivities with both arms. Treatment Plan: MFR and manual stretching to bilateral shoulders and cervical region and associated areas. AAROM progressing to AROM in supine and seated. Progress as tolerated.   Goals Short Term Goals Time to Complete Short Term Goals: 3 weeks Short Term Goal 1: Patient will be educated on HEP. Short Term Goal 2: Patient will increase her bilateral shoulders PROM to WNL for increased ability to don shirts and coats with less pain.  Short Term Goal 3: Patient will increase her bilateral shoulder pain to 5/10 when sleeping and be able to sleep for 2 hours comfortably. Short Term Goal 4: Patient will decrease fascial restrictions to min in her bilateral shoulder and cervical region for increased mobility needed to comb hair.  Long Term Goals Time to Complete Long Term Goals: 6 weeks Long Term Goal 1: Patient will return to prior level of independence with all functional, leisure, and desired acitivties. Long Term Goal 2: Patient  will increase her bilateral shoulder AROM to WNL for increased ability to be able to perform household chores with decreased difficulty.  Long Term Goal 3: Patient will decrease her pain level to 1/10 when sleeping in bed. Long Term Goal 4: Patient will decrease fascial restrictions to trace for increased ability to comb hair.   Problem List Patient Active Problem List   Diagnosis Date Noted  . Muscle weakness (generalized) 09/12/2012  . Pain in joint, shoulder region 09/10/2012  . Rotator cuff syndrome 09/10/2012    End of Session Activity Tolerance: Patient tolerated treatment well General Behavior During Therapy: WFL for tasks assessed/performed Cognition: WFL for tasks performed OT Plan of Care OT Home Exercise Plan: cervical and shoulder stretches OT Patient Instructions: handout Consulted and Agree with Plan of Care: Patient  GO Functional Assessment Tool Used: DASH score of 50 with ideal score of 0. Functional Limitation: Carrying, moving and handling objects Carrying, Moving and Handling Objects Current Status [phone]): At least 40 percent but less than 60 percent impaired, limited or restricted Carrying, Moving and Handling Objects Goal Status [phone]): 0 percent impaired, limited or restricted  Limmie Patricia, OTR/L,CBIS   09/12/2012, 12:27 PM  Physician Documentation Your signature is required to indicate approval of the treatment plan as stated above.  Please sign and either send electronically or make a copy of this report for your files and return this physician signed original.  Please mark one 1.__approve of plan  2. ___approve of plan with the following conditions.   ______________________________  _____________________ Physician Signature                                                                                                             Date

## 2012-09-17 ENCOUNTER — Ambulatory Visit (HOSPITAL_COMMUNITY)
Admission: RE | Admit: 2012-09-17 | Discharge: 2012-09-17 | Disposition: A | Discharge status: Home / Self Care | Payer: Medicare Other | Source: Ambulatory Visit | Attending: Family Medicine | Admitting: Family Medicine

## 2012-09-17 NOTE — Progress Notes (Signed)
Occupational Therapy Treatment Patient Details  Name: [name] MRN: [id number] Date of Birth: 23-Jan-1930  Today's Date: 09/17/2012 Time: 4098-1191 OT Time Calculation (min): 53 min Manual Therapy 1352-1430 38' (heat at same time, no charge) Therapeutic Exercises [phone] 15' Visit#: 2 of 12  Re-eval: 10/10/12    Authorization: [employer] Medicare  Authorization Time Period: before 10th visit  Authorization Visit#: 2 of 10  Subjective S:  I can hardly get up and down from the toilet or my bed.  My arms hurt so bad, and my legs too. Limitations: Discussed the possibility of obtaining a PT order for lower extremity strengthening.  Patient states she will follow up with her PCP.  Patient has difficulty getting up from toilet, recommended use of bedside commode to elevate seat for increased independence with transfer.  We had a donated bedside commode that I have given to the patient.   Pain Assessment Currently in Pain?: Yes Pain Score:   8 Pain Location: Shoulder Pain Orientation: Right;Left Pain Type: Acute pain  Precautions/Restrictions   progress as tolerated  Exercise/Treatments Supine Protraction: Both;PROM;10 reps Horizontal ABduction: Both;PROM;10 reps External Rotation: Both;PROM;10 reps Internal Rotation: Both;PROM;10 reps Flexion: Both;PROM;10 reps ABduction: Both;PROM;10 reps Stretches Table Stretch - Flexion: Other (comment) (10 reps BUE) Table Stretch - Abduction: Other (comment) (BUE) Table Stretch - External Rotation: Other (comment) (BUE 10 reps) Other Shoulder Stretches: table towel slide for horizontal abduction BUE 10 reps     Modalities Modalities: Moist Heat Manual Therapy Manual Therapy: Myofascial release Myofascial Release: MFR to bilateral upper arms, scapular, trapezius, rhomboid regions to decrease pain and fascial restrictions and increase pain free mobility.  Moist Heat Therapy Number Minutes Moist Heat: 15 Minutes Moist Heat Location:  Shoulder (bilateral shoulders for 10 min each shoulder )  Occupational Therapy Assessment and Plan OT Assessment and Plan Clinical Impression Statement: A:  Patient unable to complete HEP given at initial evaluation.  Patient educated on towel slides for flexion, abduction, external rotation, and horizontal abduction.  Patient able to complete with SBA for vg for technique this date.  Recommend a PT referral to address BLE weakness, patient to follow up with PCP regarding referral to PT.  Patient issued a donated bedside commode for use over her toilet, as she is having difficulty transfering on and off the commode.  OT Plan: P:  Follow up on HEP, add isometric strengthening in supine and seated elev, ext, row.  Decrease pain from 8/10 to 6/10 in her bilateral shoulders when transferring to and from a seated position   Goals Short Term Goals Time to Complete Short Term Goals: 3 weeks Short Term Goal 1: Patient will be educated on HEP. Short Term Goal 1 Progress: Progressing toward goal Short Term Goal 2: Patient will increase her bilateral shoulders PROM to WNL for increased ability to don shirts and coats with less pain.  Short Term Goal 2 Progress: Progressing toward goal Short Term Goal 3: Patient will increase her bilateral shoulder pain to 5/10 when sleeping and be able to sleep for 2 hours comfortably. Short Term Goal 3 Progress: Progressing toward goal Short Term Goal 4: Patient will decrease fascial restrictions to min in her bilateral shoulder and cervical region for increased mobility needed to comb hair.  Short Term Goal 4 Progress: Progressing toward goal Long Term Goals Time to Complete Long Term Goals: 6 weeks Long Term Goal 1: Patient will return to prior level of independence with all functional, leisure, and desired acitivties. Long Term Goal  1 Progress: Progressing toward goal Long Term Goal 2: Patient will increase her bilateral shoulder AROM to WNL for increased ability to  be able to perform household chores with decreased difficulty.  Long Term Goal 2 Progress: Progressing toward goal Long Term Goal 3: Patient will decrease her pain level to 1/10 when sleeping in bed. Long Term Goal 4: Patient will decrease fascial restrictions to trace for increased ability to comb hair.  Long Term Goal 4 Progress: Progressing toward goal  Problem List Patient Active Problem List   Diagnosis Date Noted  . Muscle weakness (generalized) 09/12/2012  . Pain in joint, shoulder region 09/10/2012  . Rotator cuff syndrome 09/10/2012    End of Session Activity Tolerance: Patient tolerated treatment well General Behavior During Therapy: WFL for tasks assessed/performed Cognition: WFL for tasks performed OT Plan of Care OT Home Exercise Plan: Patient unable to complete HEP given at initial evaluation.  Patient educated on towel slides for flexion, abduction, external rotation, and horizontal abduction.  Patient able to complete with SBA for vg for technique this date.   OT Patient Instructions: handout   Shirlean Mylar, OTR/L  09/17/2012, 3:11 PM

## 2012-09-20 ENCOUNTER — Ambulatory Visit (HOSPITAL_COMMUNITY)
Admission: RE | Admit: 2012-09-20 | Discharge: 2012-09-20 | Disposition: A | Discharge status: Home / Self Care | Payer: Medicare Other | Source: Ambulatory Visit | Attending: Family Medicine | Admitting: Family Medicine

## 2012-09-20 NOTE — Progress Notes (Signed)
Occupational Therapy Treatment Patient Details  Name: [name] MRN: [id number] Date of Birth: 1930-04-30  Today's Date: 09/20/2012 Time: 1345-1430 OT Time Calculation (min): 45 min MFR with heat at the same time 4098-1191 23' Therex 4782-9562 22'  Visit#: 3 of 12  Re-eval: 10/10/12    Authorization: [employer]BS Medicare  Authorization Time Period: before 10th visit  Authorization Visit#: 3 of 10  Subjective Symptoms/Limitations Symptoms: S:My arms are so much better than 3 weeks ago. They are still sore but getting better.  Pain Assessment Currently in Pain?: Yes Pain Score:   7 Pain Location: Shoulder Pain Orientation: Left;Right Pain Type: Acute pain  Precautions/Restrictions  Precautions Precautions: None  Exercise/Treatments Supine Protraction: Both;PROM;10 reps Horizontal ABduction: Both;PROM;10 reps External Rotation: Both;PROM;10 reps Internal Rotation: Both;PROM;10 reps Flexion: Both;PROM;10 reps ABduction: Both;PROM;10 reps Seated Elevation: PROM;10 reps Extension: PROM;10 reps Row: PROM;10 reps Therapy Ball Flexion: 10 reps ABduction: 10 reps (both)    Modalities Modalities: Moist Heat Manual Therapy Manual Therapy: Myofascial release Myofascial Release: MFR to bilateral upper arms, scapular, trapezius, rhomboid regions to decrease pain and fascial restrictions and increase pain free mobility.  Moist Heat Therapy Number Minutes Moist Heat: 15 Minutes Moist Heat Location: Shoulder  Occupational Therapy Assessment and Plan OT Assessment and Plan Clinical Impression Statement: A: Pt loves the new HEP (towel slides). She is happy she is able to complete them. 3-in-1 commode is increasing comfort with sit to stands from toilet. Added therapy ball exercises. Tolerated well.  OT Plan: P:   add isometric strengthening in supine and seated elev, ext, row.     Goals Short Term Goals Time to Complete Short Term Goals: 3 weeks Short Term Goal 1: Patient  will be educated on HEP. Short Term Goal 2: Patient will increase her bilateral shoulders PROM to WNL for increased ability to don shirts and coats with less pain.  Short Term Goal 3: Patient will increase her bilateral shoulder pain to 5/10 when sleeping and be able to sleep for 2 hours comfortably. Short Term Goal 4: Patient will decrease fascial restrictions to min in her bilateral shoulder and cervical region for increased mobility needed to comb hair.  Long Term Goals Time to Complete Long Term Goals: 6 weeks Long Term Goal 1: Patient will return to prior level of independence with all functional, leisure, and desired acitivties. Long Term Goal 2: Patient will increase her bilateral shoulder AROM to WNL for increased ability to be able to perform household chores with decreased difficulty.  Long Term Goal 3: Patient will decrease her pain level to 1/10 when sleeping in bed. Long Term Goal 4: Patient will decrease fascial restrictions to trace for increased ability to comb hair.   Problem List Patient Active Problem List   Diagnosis Date Noted  . Muscle weakness (generalized) 09/12/2012  . Pain in joint, shoulder region 09/10/2012  . Rotator cuff syndrome 09/10/2012    End of Session Activity Tolerance: Patient tolerated treatment well General Behavior During Therapy: [facility] for tasks assessed/performed Cognition: [facility] for tasks performed   Limmie Patricia, OTR/L,CBIS   09/20/2012, 2:29 PM

## 2012-09-23 ENCOUNTER — Ambulatory Visit (HOSPITAL_COMMUNITY)
Admission: RE | Admit: 2012-09-23 | Discharge: 2012-09-23 | Disposition: A | Discharge status: Home / Self Care | Payer: Medicare Other | Source: Ambulatory Visit | Attending: Family Medicine | Admitting: Family Medicine

## 2012-09-23 NOTE — Progress Notes (Signed)
Occupational Therapy Treatment Patient Details  Name: [name] MRN: [id number] Date of Birth: 08-Apr-1930  Today's Date: 09/23/2012 Time: 9528-4132 OT Time Calculation (min): 42 min MFR 4401-0272 21' Therex 5366-4403 21'  Visit#: 4 of 12  Re-eval: 10/10/12    Authorization: [employer] Medicare  Authorization Time Period: before 10th visit  Authorization Visit#: 4 of 10  Subjective Symptoms/Limitations Symptoms: S: My whole body is giving me trouble.  Pain Assessment Currently in Pain?: Yes Pain Score:   7 Pain Location: Shoulder Pain Orientation: Left;Right Pain Type: Acute pain  Precautions/Restrictions  Precautions Precautions: None  Exercise/Treatments Supine Protraction: Both;PROM;10 reps Horizontal ABduction: Both;PROM;10 reps External Rotation: Both;PROM;10 reps Internal Rotation: Both;PROM;10 reps Flexion: Both;PROM;10 reps ABduction: Both;PROM;10 reps ROM / Strengthening / Isometric Strengthening   Flexion: 3X3" Extension: 3X3" External Rotation: 3X3" Internal Rotation: 3X3" ABduction: 3X3" ADduction: 3X3"     Manual Therapy Manual Therapy: Myofascial release Myofascial Release: MFR to bilateral upper arms, scapular, trapezius, rhomboid regions to decrease pain and fascial restrictions and increase pain free mobility.  Occupational Therapy Assessment and Plan OT Assessment and Plan Clinical Impression Statement: A: Discussed talking to her PCP about getting a PT order. Added isometric supine. Tolerated well. OT Plan: P:   add isometric strengthening in  seated elev, ext, row.     Goals Short Term Goals Time to Complete Short Term Goals: 3 weeks Short Term Goal 1: Patient will be educated on HEP. Short Term Goal 1 Progress: Progressing toward goal Short Term Goal 2: Patient will increase her bilateral shoulders PROM to WNL for increased ability to don shirts and coats with less pain.  Short Term Goal 2 Progress: Progressing toward goal Short Term  Goal 3: Patient will increase her bilateral shoulder pain to 5/10 when sleeping and be able to sleep for 2 hours comfortably. Short Term Goal 3 Progress: Progressing toward goal Short Term Goal 4: Patient will decrease fascial restrictions to min in her bilateral shoulder and cervical region for increased mobility needed to comb hair.  Short Term Goal 4 Progress: Progressing toward goal Long Term Goals Time to Complete Long Term Goals: 6 weeks Long Term Goal 1: Patient will return to prior level of independence with all functional, leisure, and desired acitivties. Long Term Goal 1 Progress: Progressing toward goal Long Term Goal 2: Patient will increase her bilateral shoulder AROM to WNL for increased ability to be able to perform household chores with decreased difficulty.  Long Term Goal 2 Progress: Progressing toward goal Long Term Goal 3: Patient will decrease her pain level to 1/10 when sleeping in bed. Long Term Goal 3 Progress: Progressing toward goal Long Term Goal 4: Patient will decrease fascial restrictions to trace for increased ability to comb hair.  Long Term Goal 4 Progress: Progressing toward goal  Problem List Patient Active Problem List   Diagnosis Date Noted  . Muscle weakness (generalized) 09/12/2012  . Pain in joint, shoulder region 09/10/2012  . Rotator cuff syndrome 09/10/2012    End of Session Activity Tolerance: Patient tolerated treatment well General Behavior During Therapy: [facility] for tasks assessed/performed Cognition: [facility] for tasks performed   Limmie Patricia, OTR/L,CBIS   09/23/2012, 3:49 PM

## 2012-09-26 ENCOUNTER — Ambulatory Visit (HOSPITAL_COMMUNITY)
Admission: RE | Admit: 2012-09-26 | Discharge: 2012-09-26 | Disposition: A | Discharge status: Home / Self Care | Payer: Medicare Other | Source: Ambulatory Visit | Attending: Orthopedic Surgery | Admitting: Orthopedic Surgery

## 2012-09-26 NOTE — Progress Notes (Signed)
Occupational Therapy Treatment Patient Details  Name: [name] MRN: [id number] Date of Birth: 18-Apr-1930  Today's Date: 09/26/2012 Time: 1020-1100 OT Time Calculation (min): 40 min MFR/Moist Heat 1020-1047 27' Therex 1047-1100 13'  Visit#: 5 of 12  Re-eval: 10/10/12    Authorization: BCBS Medicare  Authorization Time Period: before 10th visit  Authorization Visit#: 5 of 10  Subjective Symptoms/Limitations Symptoms: S: The air was on last night and it was too cold. My shoulder feel so stiff.  Pain Assessment Currently in Pain?: No/denies  Precautions/Restrictions  Precautions Precautions: None  Exercise/Treatments Supine Protraction: Both;PROM;10 reps Horizontal ABduction: Both;PROM;10 reps External Rotation: Both;PROM;10 reps Internal Rotation: Both;PROM;10 reps Flexion: Both;PROM;10 reps ABduction: Both;PROM;10 reps    Modalities Modalities: Moist Heat Manual Therapy Manual Therapy: Myofascial release Myofascial Release: MFR to bilateral upper arms, scapular, trapezius, rhomboid regions to decrease pain and fascial restrictions and increase pain free mobility. Moist Heat Therapy Number Minutes Moist Heat: 15 Minutes Moist Heat Location: Shoulder  Occupational Therapy Assessment and Plan OT Assessment and Plan Clinical Impression Statement: A: MFR manual stretching took longer today so we were unable to complete all exercises. Pt was able to achieve full PROM during manual stretching this date.  OT Plan: P:   add isometric strengthening in  seated elev, ext, row.     Goals Short Term Goals Time to Complete Short Term Goals: 3 weeks Short Term Goal 1: Patient will be educated on HEP. Short Term Goal 2: Patient will increase her bilateral shoulders PROM to WNL for increased ability to don shirts and coats with less pain.  Short Term Goal 3: Patient will increase her bilateral shoulder pain to 5/10 when sleeping and be able to sleep for 2 hours  comfortably. Short Term Goal 4: Patient will decrease fascial restrictions to min in her bilateral shoulder and cervical region for increased mobility needed to comb hair.  Long Term Goals Time to Complete Long Term Goals: 6 weeks Long Term Goal 1: Patient will return to prior level of independence with all functional, leisure, and desired acitivties. Long Term Goal 2: Patient will increase her bilateral shoulder AROM to WNL for increased ability to be able to perform household chores with decreased difficulty.  Long Term Goal 3: Patient will decrease her pain level to 1/10 when sleeping in bed. Long Term Goal 4: Patient will decrease fascial restrictions to trace for increased ability to comb hair.   Problem List Patient Active Problem List   Diagnosis Date Noted  . Muscle weakness (generalized) 09/12/2012  . Pain in joint, shoulder region 09/10/2012  . Rotator cuff syndrome 09/10/2012    End of Session Activity Tolerance: Patient tolerated treatment well General Behavior During Therapy: [facility] for tasks assessed/performed Cognition: [facility] for tasks performed   Limmie Patricia, OTR/L,CBIS   09/26/2012, 12:32 PM

## 2012-09-27 ENCOUNTER — Encounter (HOSPITAL_COMMUNITY): Payer: Self-pay | Admitting: *Deleted

## 2012-09-27 ENCOUNTER — Emergency Department (HOSPITAL_COMMUNITY)
Admission: EM | Admit: 2012-09-27 | Discharge: 2012-09-27 | Disposition: A | Discharge status: Home / Self Care | Payer: Medicare Other | Attending: Emergency Medicine | Admitting: Emergency Medicine

## 2012-09-27 DIAGNOSIS — Z79899 Other long term (current) drug therapy: Secondary | ICD-10-CM | POA: Insufficient documentation

## 2012-09-27 DIAGNOSIS — M129 Arthropathy, unspecified: Secondary | ICD-10-CM | POA: Insufficient documentation

## 2012-09-27 DIAGNOSIS — I10 Essential (primary) hypertension: Secondary | ICD-10-CM | POA: Insufficient documentation

## 2012-09-27 DIAGNOSIS — M6281 Muscle weakness (generalized): Secondary | ICD-10-CM | POA: Insufficient documentation

## 2012-09-27 DIAGNOSIS — Z85820 Personal history of malignant melanoma of skin: Secondary | ICD-10-CM | POA: Insufficient documentation

## 2012-09-27 LAB — COMPREHENSIVE METABOLIC PANEL
ALT: 9 U/L (ref 0–35)
AST: 14 U/L (ref 0–37)
Albumin: 3.4 g/dL — ABNORMAL LOW (ref 3.5–5.2)
Alkaline Phosphatase: 66 U/L (ref 39–117)
Chloride: 98 mEq/L (ref 96–112)
Potassium: 4 mEq/L (ref 3.5–5.1)
Sodium: 136 mEq/L (ref 135–145)
Total Bilirubin: 0.3 mg/dL (ref 0.3–1.2)
Total Protein: 7.1 g/dL (ref 6.0–8.3)

## 2012-09-27 LAB — CBC WITH DIFFERENTIAL/PLATELET
Basophils Relative: 0 % (ref 0–1)
Eosinophils Absolute: 0.1 10*3/uL (ref 0.0–0.7)
Hemoglobin: 11.8 g/dL — ABNORMAL LOW (ref 12.0–15.0)
MCH: 30.9 pg (ref 26.0–34.0)
MCHC: 32.8 g/dL (ref 30.0–36.0)
Neutro Abs: 8.9 10*3/uL — ABNORMAL HIGH (ref 1.7–7.7)
Neutrophils Relative %: 79 % — ABNORMAL HIGH (ref 43–77)
Platelets: 354 10*3/uL (ref 150–400)
RBC: 3.82 MIL/uL — ABNORMAL LOW (ref 3.87–5.11)

## 2012-09-27 LAB — URINE MICROSCOPIC-ADD ON

## 2012-09-27 LAB — URINALYSIS, ROUTINE W REFLEX MICROSCOPIC
Bilirubin Urine: NEGATIVE
Glucose, UA: NEGATIVE mg/dL
Ketones, ur: NEGATIVE mg/dL
pH: 7 (ref 5.0–8.0)

## 2012-09-27 MED ORDER — OXYCODONE-ACETAMINOPHEN 5-325 MG PO TABS: 1.0000 | ORAL_TABLET | ORAL | Status: DC | PRN

## 2012-09-27 MED ORDER — PREDNISONE 10 MG PO TABS: 20.0000 mg | ORAL_TABLET | Freq: Every day | ORAL | Status: DC

## 2012-09-27 MED ORDER — SODIUM CHLORIDE 0.9 % IV SOLN: INTRAVENOUS | Status: DC

## 2012-09-27 MED ORDER — PREDNISONE 50 MG PO TABS
60.0000 mg | ORAL_TABLET | Freq: Once | ORAL | Status: AC
  Administered 2012-09-27: 60 mg via ORAL
  Filled 2012-09-27: qty 1

## 2012-09-27 NOTE — ED Provider Notes (Signed)
History    This chart was scribed for Toy Baker, MD by Donne Anon, ED Scribe. This patient was seen in room APA10/APA10 and the patient's care was started at 1807.    CSN: [id number]  Arrival date & time 09/27/12  1615   First MD Initiated Contact with Patient 09/27/12 1807      Chief Complaint  Patient presents with  . Generalized Body Aches     The history is provided by the patient. No language interpreter was used.   HPI Comments: [name] is a 79 y.o. female who presents to the Emergency Department complaining of 2 weeks of gradual onset, gradually worsening, waxing and waning generalized body aches, which are localized mostly in her legs. The pain is worse when she tries to stand up from a sitting position. She was diagnosed and began treatment for a torn rotator cuff about 1.5 months ago. She has spoken to her PCP and orthopedist who recommended occupational therapy, which she has been compliant with. Her PCP recommended she come here today. She has tried [employer] and Tylenol with little relief. She denies lower back pain, incontinence or any other pain.  Her PCP is Dr. Regino Schultze. Her orthopedist is Dr. Romeo Apple.  Past Medical History  Diagnosis Date  . Hypertension   . Arthritis   . Cancer     melanoma of nose    Past Surgical History  Procedure Laterality Date  . Abdominal hysterectomy      1982  . Cesarean section      x4  . Tonsillectomy      age 31  . Appendectomy      with 1 c-section  . Cataract extraction w/phaco  07/31/2011    Procedure: CATARACT EXTRACTION PHACO AND INTRAOCULAR LENS PLACEMENT (IOC);  Surgeon: Gemma Payor, MD;  Location: [facility];  Service: Ophthalmology;  Laterality: Right;  CDE: 11.31  . Cataract extraction w/phaco  08/14/2011    Procedure: CATARACT EXTRACTION PHACO AND INTRAOCULAR LENS PLACEMENT (IOC);  Surgeon: Gemma Payor, MD;  Location: [facility];  Service: Ophthalmology;  Laterality: Left;  CDE: 10.25    Family History  Problem  Relation Age of Onset  . Anesthesia problems Neg Hx   . Hypotension Neg Hx   . Malignant hyperthermia Neg Hx   . Pseudochol deficiency Neg Hx     History  Substance Use Topics  . Smoking status: Never Smoker   . Smokeless tobacco: Not on file  . Alcohol Use:      Comment: socially    OB History   Grav Para Term Preterm Abortions TAB SAB Ect Mult Living                  Review of Systems  Musculoskeletal: Positive for myalgias.  All other systems reviewed and are negative.    Allergies  Review of patient's allergies indicates no known allergies.  Home Medications   Current Outpatient Rx  Name  Route  Sig  Dispense  Refill  . acetaminophen (TYLENOL) 500 MG tablet   Oral   Take 500 mg by mouth every 6 (six) hours as needed. For pain         . clobetasol cream (TEMOVATE) 0.05 %   Topical   Apply 1 application topically 2 (two) times daily as needed. For rash         . lisinopril (PRINIVIL,ZESTRIL) 5 MG tablet   Oral   Take 5 mg by mouth daily.         Marland Kitchen  Multiple Vitamin (MULITIVITAMIN WITH MINERALS) TABS   Oral   Take 1 tablet by mouth daily.         Marland Kitchen triamcinolone ointment (KENALOG) 0.1 %   Topical   Apply 1 application topically 2 (two) times daily as needed. For rash           BP 155/96  Pulse 86  Temp(Src) 97.2 F (36.2 C) (Oral)  Resp 22  Ht 5\' 7"  (1.702 m)  Wt 183 lb (83.008 kg)  BMI 28.66 kg/m2  SpO2 98%  Physical Exam  Nursing note and vitals reviewed. Constitutional: She is oriented to person, place, and time. She appears well-developed and well-nourished. No distress.  HENT:  Head: Normocephalic and atraumatic.  Eyes: EOM are normal.  Neck: Neck supple. No tracheal deviation present.  Cardiovascular: Normal rate.   Pulmonary/Chest: Effort normal. No respiratory distress.  Musculoskeletal: Normal range of motion.  Neurological: She is alert and oriented to person, place, and time. She has normal strength. She displays a  negative Romberg sign.  Skin: Skin is warm and dry.  Psychiatric: She has a normal mood and affect. Her behavior is normal.    ED Course  Procedures (including critical care time) DIAGNOSTIC STUDIES: Oxygen Saturation is 98% on room air, normal by my interpretation.    COORDINATION OF CARE: 6:27 PM Discussed treatment plan which includes labs and urinalysis with pt at bedside and pt agreed to plan.     Labs Reviewed  CBC WITH DIFFERENTIAL  COMPREHENSIVE METABOLIC PANEL  URINALYSIS, ROUTINE W REFLEX MICROSCOPIC   No results found.   No diagnosis found.    MDM  Pt with muscle pain and elevated esr, suspect pmr, will start prednisone and pt to f/u her pcp  I personally performed the services described in this documentation, which was scribed in my presence. The recorded information has been reviewed and is accurate.         Toy Baker, MD 09/27/12 2011

## 2012-09-27 NOTE — ED Notes (Signed)
Generalized body aches onset over a month ago

## 2012-09-29 LAB — URINE CULTURE

## 2012-10-01 ENCOUNTER — Ambulatory Visit (HOSPITAL_COMMUNITY)
Admission: RE | Admit: 2012-10-01 | Discharge: 2012-10-01 | Disposition: A | Discharge status: Home / Self Care | Payer: Medicare Other | Source: Ambulatory Visit | Attending: Orthopedic Surgery | Admitting: Orthopedic Surgery

## 2012-10-01 NOTE — Progress Notes (Signed)
Occupational Therapy Treatment Patient Details  Name: [name] MRN: [id number] Date of Birth: 17-Apr-1930  Today's Date: 10/01/2012 Time: 1040-1100 OT Time Calculation (min): 20 min Manual therapy 20' Heat no charge  Visit#: 6 of 12  Re-eval: 10/10/12    Authorization: BCBS Medicare  Authorization Time Period: before 10th visit  Authorization Visit#: 6 of 10  Subjective S:  I went to the ED over the weekend.  I was having so much pain in my arms and legs.  They gave me predisone which made it much better, however I had to plunge a toilet this am, and my shoulders hurt in the front pretty bad.  Pain Assessment Currently in Pain?: Yes Pain Score:   8 Pain Location: Shoulder Pain Orientation: Right;Left Pain Type: Chronic pain  Precautions/Restrictions   n/a  Exercise/Treatments     Modalities Modalities: Moist Heat Manual Therapy Myofascial Release: MFR to bilateral upper arms, scapular, trapezius, rhomboid regions to decrease pain and fascial restrictions and increase pain free mobility. Moist Heat Therapy Number Minutes Moist Heat: 10 Minutes Moist Heat Location: Shoulder (bilateral, heating shoulder while alternate shoulder manual)  Occupational Therapy Assessment and Plan OT Assessment and Plan Clinical Impression Statement: A:  arrived 25 minutes late for appointment this date, therefore, completed manual therapy only.   OT Plan: P:  Resume all therapeutic exercises, add isometric strengthening in seated for ext, row, retraction.   Goals Short Term Goals Time to Complete Short Term Goals: 3 weeks Short Term Goal 1: Patient will be educated on HEP. Short Term Goal 2: Patient will increase her bilateral shoulders PROM to WNL for increased ability to don shirts and coats with less pain.  Short Term Goal 3: Patient will increase her bilateral shoulder pain to 5/10 when sleeping and be able to sleep for 2 hours comfortably. Short Term Goal 4: Patient will decrease  fascial restrictions to min in her bilateral shoulder and cervical region for increased mobility needed to comb hair.  Long Term Goals Time to Complete Long Term Goals: 6 weeks Long Term Goal 1: Patient will return to prior level of independence with all functional, leisure, and desired acitivties. Long Term Goal 2: Patient will increase her bilateral shoulder AROM to WNL for increased ability to be able to perform household chores with decreased difficulty.  Long Term Goal 3: Patient will decrease her pain level to 1/10 when sleeping in bed. Long Term Goal 4: Patient will decrease fascial restrictions to trace for increased ability to comb hair.   Problem List Patient Active Problem List   Diagnosis Date Noted  . Muscle weakness (generalized) 09/12/2012  . Pain in joint, shoulder region 09/10/2012  . Rotator cuff syndrome 09/10/2012    End of Session Activity Tolerance: Patient tolerated treatment well General Behavior During Therapy: [facility] for tasks assessed/performed Cognition: WFL for tasks performed  GO    Shirlean Mylar, OTR/L  10/01/2012, 11:25 AM

## 2012-10-03 ENCOUNTER — Ambulatory Visit (HOSPITAL_COMMUNITY)
Admission: RE | Admit: 2012-10-03 | Discharge: 2012-10-03 | Disposition: A | Discharge status: Home / Self Care | Payer: Medicare Other | Source: Ambulatory Visit | Attending: Orthopedic Surgery | Admitting: Orthopedic Surgery

## 2012-10-03 NOTE — Progress Notes (Signed)
Occupational Therapy Treatment Patient Details  Name: [name] MRN: [id number] Date of Birth: 1929/09/23  Today's Date: 10/03/2012 Time: 4098-1191 OT Time Calculation (min): 45 min Manual Therapy [phone] 27' Therapeutic Exercises [phone] 18' Visit#: 7 of 12  Re-eval: 10/10/12    Authorization: BCBS Medicare  Authorization Time Period: before 10th visit  Authorization Visit#: 7 of 10  Subjective Symptoms/Limitations Symptoms: S:  If I dont move my shoulders dont hurt.  IF I move, they are very painful.  Pain Assessment Currently in Pain?: Yes Pain Score:   8 Pain Location: Shoulder Pain Orientation: Right;Left Pain Type: Chronic pain  Precautions/Restrictions     Exercise/Treatments Supine Protraction: Both;PROM;AAROM;10 reps Horizontal ABduction: Both;PROM;AAROM;10 reps Flexion: Both;PROM;AAROM;10 reps Therapy Ball Flexion: 20 reps ABduction: 20 reps     Manual Therapy Manual Therapy: Myofascial release Myofascial Release: MFR to bilateral upper arms, scapular, trapezius, rhomboid regions to decrease pain and fascial restrictions and increase pain free mobility. Moist Heat Therapy Number Minutes Moist Heat: 10 Minutes Moist Heat Location: Shoulder (heat on alternate shoulder of shoulder recieving manual ther)  Occupational Therapy Assessment and Plan OT Assessment and Plan Clinical Impression Statement: A:  Patient consistently presenting with increased pain in her bilateral shoulders, question if it is arthritic type pain, however, patient states she has never experienced arthritis and wouldnt know what it feels like.  Attempted dowel rod exercises in supine, able to complete protraction, horizontal abduction, and flexion 5 reps then requested to stop as exercise was aggrevating her right shoulder.  OT Plan: P:  Add isometric strengthening in supine in attempts to strengthen shoudlers and lessen pain with AROM.   Goals Short Term Goals Time to Complete  Short Term Goals: 3 weeks Short Term Goal 1: Patient will be educated on HEP. Short Term Goal 1 Progress: Progressing toward goal Short Term Goal 2: Patient will increase her bilateral shoulders PROM to WNL for increased ability to don shirts and coats with less pain.  Short Term Goal 2 Progress: Progressing toward goal Short Term Goal 3: Patient will increase her bilateral shoulder pain to 5/10 when sleeping and be able to sleep for 2 hours comfortably. Short Term Goal 3 Progress: Progressing toward goal Short Term Goal 4: Patient will decrease fascial restrictions to min in her bilateral shoulder and cervical region for increased mobility needed to comb hair.  Short Term Goal 4 Progress: Progressing toward goal Long Term Goals Time to Complete Long Term Goals: 6 weeks Long Term Goal 1: Patient will return to prior level of independence with all functional, leisure, and desired acitivties. Long Term Goal 1 Progress: Progressing toward goal Long Term Goal 2: Patient will increase her bilateral shoulder AROM to WNL for increased ability to be able to perform household chores with decreased difficulty.  Long Term Goal 2 Progress: Progressing toward goal Long Term Goal 3: Patient will decrease her pain level to 1/10 when sleeping in bed. Long Term Goal 3 Progress: Progressing toward goal Long Term Goal 4: Patient will decrease fascial restrictions to trace for increased ability to comb hair.  Long Term Goal 4 Progress: Progressing toward goal  Problem List Patient Active Problem List   Diagnosis Date Noted  . Muscle weakness (generalized) 09/12/2012  . Pain in joint, shoulder region 09/10/2012  . Rotator cuff syndrome 09/10/2012    End of Session Activity Tolerance: Patient tolerated treatment well General Behavior During Therapy: [facility] for tasks assessed/performed Cognition: WFL for tasks performed   Shirlean Mylar, OTR/L  10/03/2012, 10:22 AM

## 2012-10-08 ENCOUNTER — Ambulatory Visit (HOSPITAL_COMMUNITY)
Admission: RE | Admit: 2012-10-08 | Discharge: 2012-10-08 | Disposition: A | Discharge status: Home / Self Care | Payer: Medicare Other | Source: Ambulatory Visit | Attending: Family Medicine | Admitting: Family Medicine

## 2012-10-08 DIAGNOSIS — IMO0001 Reserved for inherently not codable concepts without codable children: Secondary | ICD-10-CM | POA: Insufficient documentation

## 2012-10-08 DIAGNOSIS — M25519 Pain in unspecified shoulder: Secondary | ICD-10-CM | POA: Insufficient documentation

## 2012-10-08 DIAGNOSIS — M6281 Muscle weakness (generalized): Secondary | ICD-10-CM | POA: Insufficient documentation

## 2012-10-08 DIAGNOSIS — M25619 Stiffness of unspecified shoulder, not elsewhere classified: Secondary | ICD-10-CM | POA: Insufficient documentation

## 2012-10-08 NOTE — Progress Notes (Signed)
Occupational Therapy Treatment Patient Details  Name: [name] MRN: [id number] Date of Birth: 1929-11-18  Today's Date: 10/08/2012 Time: 1356-1500 OT Time Calculation (min): 64 min MFR (moist heat at the same time) 4098-1191 24' Therex 1420-1500 40'  Visit#: 8 of 12  Re-eval: 10/10/12    Authorization: BCBS Medicare  Authorization Time Period: before 10th visit  Authorization Visit#: 8 of 10  Subjective Symptoms/Limitations Symptoms: S: I do a lot of sitting. Which probably isn't good.  Pain Assessment Currently in Pain?: No/denies (only with movement)  Precautions/Restrictions  Precautions Precautions: None  Exercise/Treatments Supine Protraction: Both;PROM;AAROM;10 reps Horizontal ABduction: Both;PROM;AAROM;10 reps External Rotation: Both;PROM;AAROM;10 reps Internal Rotation: Both;PROM;AAROM;10 reps Flexion: Both;PROM;AAROM;10 reps ABduction: Both;PROM;AAROM;10 reps Therapy Ball Flexion: 20 reps ABduction: 20 reps ROM / Strengthening / Isometric Strengthening   Flexion: 3X5" Extension: 3X5" External Rotation: 3X5" Internal Rotation: 3X5" ABduction: 3X5" ADduction: 3X5"    Modalities Modalities: Moist Heat Manual Therapy Manual Therapy: Myofascial release Myofascial Release: MFR to bilateral upper arms, scapular, trapezius, rhomboid regions to decrease pain and fascial restrictions and increase pain free mobility. Moist Heat Therapy Number Minutes Moist Heat: 15 Minutes Moist Heat Location: Shoulder  Occupational Therapy Assessment and Plan OT Assessment and Plan Clinical Impression Statement: A: Patient has increased PROM this date in right shoulder. Patient completted all AAROM exercises supine. Increased isometrics to 3x5. Tolerated well.  OT Plan: P: Reassess and update G-code.   Goals Short Term Goals Time to Complete Short Term Goals: 3 weeks Short Term Goal 1: Patient will be educated on HEP. Short Term Goal 1 Progress: Progressing toward  goal Short Term Goal 2: Patient will increase her bilateral shoulders PROM to WNL for increased ability to don shirts and coats with less pain.  Short Term Goal 2 Progress: Progressing toward goal Short Term Goal 3: Patient will increase her bilateral shoulder pain to 5/10 when sleeping and be able to sleep for 2 hours comfortably. Short Term Goal 3 Progress: Progressing toward goal Short Term Goal 4: Patient will decrease fascial restrictions to min in her bilateral shoulder and cervical region for increased mobility needed to comb hair.  Short Term Goal 4 Progress: Progressing toward goal Long Term Goals Time to Complete Long Term Goals: 6 weeks Long Term Goal 1: Patient will return to prior level of independence with all functional, leisure, and desired acitivties. Long Term Goal 1 Progress: Progressing toward goal Long Term Goal 2: Patient will increase her bilateral shoulder AROM to WNL for increased ability to be able to perform household chores with decreased difficulty.  Long Term Goal 2 Progress: Progressing toward goal Long Term Goal 3: Patient will decrease her pain level to 1/10 when sleeping in bed. Long Term Goal 3 Progress: Progressing toward goal Long Term Goal 4: Patient will decrease fascial restrictions to trace for increased ability to comb hair.  Long Term Goal 4 Progress: Progressing toward goal  Problem List Patient Active Problem List   Diagnosis Date Noted  . Muscle weakness (generalized) 09/12/2012  . Pain in joint, shoulder region 09/10/2012  . Rotator cuff syndrome 09/10/2012    End of Session Activity Tolerance: Patient tolerated treatment well General Behavior During Therapy: [facility] for tasks assessed/performed Cognition: [facility] for tasks performed   Limmie Patricia, OTR/L,CBIS   10/08/2012, 3:02 PM

## 2012-10-11 ENCOUNTER — Ambulatory Visit (HOSPITAL_COMMUNITY): Payer: Medicare Other | Admitting: Specialist

## 2012-10-14 ENCOUNTER — Ambulatory Visit (HOSPITAL_COMMUNITY): Payer: Medicare Other | Admitting: Specialist

## 2012-10-15 ENCOUNTER — Encounter: Payer: Self-pay | Admitting: Orthopedic Surgery

## 2012-10-15 ENCOUNTER — Ambulatory Visit (INDEPENDENT_AMBULATORY_CARE_PROVIDER_SITE_OTHER): Payer: Medicare Other | Admitting: Orthopedic Surgery

## 2012-10-15 VITALS — BP 140/60 | Ht 67.0 in | Wt 183.0 lb

## 2012-10-15 DIAGNOSIS — M47812 Spondylosis without myelopathy or radiculopathy, cervical region: Secondary | ICD-10-CM

## 2012-10-15 DIAGNOSIS — M67919 Unspecified disorder of synovium and tendon, unspecified shoulder: Secondary | ICD-10-CM

## 2012-10-15 DIAGNOSIS — M129 Arthropathy, unspecified: Secondary | ICD-10-CM

## 2012-10-15 DIAGNOSIS — M75100 Unspecified rotator cuff tear or rupture of unspecified shoulder, not specified as traumatic: Secondary | ICD-10-CM

## 2012-10-15 NOTE — Progress Notes (Signed)
Patient ID: [name], female   DOB: 1929-06-16, 79 y.o.   MRN: [id number] Chief Complaint  Patient presents with  . Follow-up    Bilateral shoulder following therapy     The patient also received 2 cortisone injections for what we thought was bursitis of the shoulder  She did make improvement with therapy she now has 120 of active forward elevation. Chest some weakness in the right shoulder when reaching away from her body  Review of systems next if this but no numbness or tingling in the hands, she does have. Articular inflammation somewhat relieved with oral prednisone currently undergoing labwork her primary care physician.  General appearance is normal, the patient is alert and oriented x3 with normal mood and affect. BP 140/60  Ht 5\' 7"  (1.702 m)  Wt 183 lb (83.008 kg)  BMI 28.66 kg/m2 Again we see bilateral forward elevation 120. Neurovascular exam intact. Weakness in the right shoulder.  Impression rotator cuff syndrome cervical arthrosis possible periarticular arthritis  Continue physical therapy if no improvement patient will call us back and we will order MRI of the right shoulder

## 2012-10-15 NOTE — Patient Instructions (Addendum)
During laboratory studies to the office when they're completed  If you're not improved after physical therapy please call the office so that we can schedule an MRI of her right shoulder to review for rotator cuff tear

## 2012-10-16 ENCOUNTER — Ambulatory Visit (HOSPITAL_COMMUNITY)
Admission: RE | Admit: 2012-10-16 | Discharge: 2012-10-16 | Disposition: A | Discharge status: Home / Self Care | Payer: Medicare Other | Source: Ambulatory Visit | Attending: Orthopedic Surgery | Admitting: Orthopedic Surgery

## 2012-10-16 NOTE — Evaluation (Signed)
Occupational Therapy Re-Evaluation  Patient Details  Name: [name] MRN: [id number] Date of Birth: Oct 29, 1929  Today's Date: 10/16/2012 Time: 1020-1120 OT Time Calculation (min): 60 min MFR 1020-1050 30' Reassess 1050-1100 10'  Visit#: 9 of 20  Re-eval: 11/13/12  Assessment Diagnosis: Bilateral rotator cuff syndrome  Authorization: [employer] Medicare  Authorization Time Period: before 18th visit  Authorization Visit#: 9 of 18   Past Medical History:  Past Medical History  Diagnosis Date  . Hypertension   . Arthritis   . Cancer     melanoma of nose   Past Surgical History:  Past Surgical History  Procedure Laterality Date  . Abdominal hysterectomy      1982  . Cesarean section      x4  . Tonsillectomy      age 24  . Appendectomy      with 1 c-section  . Cataract extraction w/phaco  07/31/2011    Procedure: CATARACT EXTRACTION PHACO AND INTRAOCULAR LENS PLACEMENT (IOC);  Surgeon: Gemma Payor, MD;  Location: [facility];  Service: Ophthalmology;  Laterality: Right;  CDE: 11.31  . Cataract extraction w/phaco  08/14/2011    Procedure: CATARACT EXTRACTION PHACO AND INTRAOCULAR LENS PLACEMENT (IOC);  Surgeon: Gemma Payor, MD;  Location: [facility];  Service: Ophthalmology;  Laterality: Left;  CDE: 10.25    Subjective Symptoms/Limitations Symptoms: S: I went to see Dr. Romeo Apple and he was very impressed with my progress. He definitely wants me to continue with therapy. Pain Assessment Currently in Pain?: No/denies  Precautions/Restrictions  Precautions Precautions: None  Assessment Additional Assessments RUE AROM (degrees) RUE Overall AROM Comments: assessed seated. IR/ER ADD Right Shoulder Flexion: 160 Degrees (on eval: 104) Right Shoulder ABduction: 130 Degrees (on eval: 83) Right Shoulder Internal Rotation: 90 Degrees (on eval: 90) Right Shoulder External Rotation: 90 Degrees (on eval: 88) RUE Strength Right Shoulder Flexion: 4/5 Right Shoulder ABduction: 4/5 (on eval:  4-/5) Right Shoulder Internal Rotation: 4/5 (on eval: 4-/5) Right Shoulder External Rotation: 4/5 (on eval: 4-/5) LUE AROM (degrees) Left Shoulder Flexion: 150 Degrees (on eva;: 102) Left Shoulder ABduction: 99 Degrees (on eval: 66) Left Shoulder Internal Rotation: 90 Degrees (on eval: 90) Left Shoulder External Rotation: 90 Degrees (on eval: 71) LUE Strength Left Shoulder Flexion: 4/5 Left Shoulder ABduction: 4/5 (on eval: 4-/5) Left Shoulder Internal Rotation: 4/5 (on eval: 4-/5) Left Shoulder External Rotation: 4/5 (on eval: 4-/5) Palpation Palpation: Min fascial restrictions in cervical region, bilateral scapularis and trapezius.     Exercise/Treatments    Manual Therapy Manual Therapy: Myofascial release Myofascial Release: MFR to bilateral upper arms, scapular, trapezius, rhomboid regions to decrease pain and fascial restrictions and increase pain free mobility  Occupational Therapy Assessment and Plan OT Assessment and Plan Clinical Impression Statement: A: See MD note for progress. Patient has met all STGs and progressing towards LTGs.  OT Plan: P: Attempt AAROM Seated and AROM supine.   Goals Short Term Goals Time to Complete Short Term Goals: 3 weeks Short Term Goal 1: Patient will be educated on HEP. Short Term Goal 1 Progress: Met Short Term Goal 2: Patient will increase her bilateral shoulders PROM to WNL for increased ability to don shirts and coats with less pain.  Short Term Goal 2 Progress: Met Short Term Goal 3: Patient will increase her bilateral shoulder pain to 5/10 when sleeping and be able to sleep for 2 hours comfortably. Short Term Goal 3 Progress: Met Short Term Goal 4: Patient will decrease fascial restrictions to min in  her bilateral shoulder and cervical region for increased mobility needed to comb hair.  Short Term Goal 4 Progress: Met Long Term Goals Time to Complete Long Term Goals: 6 weeks Long Term Goal 1: Patient will return to prior level  of independence with all functional, leisure, and desired acitivties. Long Term Goal 1 Progress: Progressing toward goal Long Term Goal 2: Patient will increase her bilateral shoulder AROM to WNL for increased ability to be able to perform household chores with decreased difficulty.  Long Term Goal 2 Progress: Progressing toward goal Long Term Goal 3: Patient will decrease her pain level to 1/10 when sleeping in bed. Long Term Goal 3 Progress: Met Long Term Goal 4: Patient will decrease fascial restrictions to trace for increased ability to comb hair.  Long Term Goal 4 Progress: Progressing toward goal  Problem List Patient Active Problem List   Diagnosis Date Noted  . Cervical arthritis 10/15/2012  . Arthritis/arthropathy of multiple joints 10/15/2012  . Muscle weakness (generalized) 09/12/2012  . Pain in joint, shoulder region 09/10/2012  . Rotator cuff syndrome 09/10/2012    End of Session Activity Tolerance: Patient tolerated treatment well General Behavior During Therapy: WFL for tasks assessed/performed Cognition: WFL for tasks performed  GO Functional Assessment Tool Used: DASH score of 31.8 with ideal score of 0. (On eval: 50) Functional Limitation: Carrying, moving and handling objects Carrying, Moving and Handling Objects Current Status [phone]): At least 20 percent but less than 40 percent impaired, limited or restricted Carrying, Moving and Handling Objects Goal Status [phone]): 0 percent impaired, limited or restricted  Limmie Patricia, OTR/L,CBIS   10/16/2012, 11:51 AM  Physician Documentation Your signature is required to indicate approval of the treatment plan as stated above.  Please sign and either send electronically or make a copy of this report for your files and return this physician signed original.  Please mark one 1.__approve of plan  2. ___approve of plan with the following conditions.   ______________________________                                                           _____________________ Physician Signature                                                                                                             Date

## 2012-10-21 ENCOUNTER — Ambulatory Visit (HOSPITAL_COMMUNITY): Payer: Medicare Other | Admitting: Specialist

## 2012-10-23 ENCOUNTER — Ambulatory Visit (HOSPITAL_COMMUNITY)
Admission: RE | Admit: 2012-10-23 | Discharge: 2012-10-23 | Disposition: A | Discharge status: Home / Self Care | Payer: Medicare Other | Source: Ambulatory Visit | Attending: Family Medicine | Admitting: Family Medicine

## 2012-10-23 NOTE — Progress Notes (Signed)
Occupational Therapy Treatment Patient Details  Name: [name] MRN: [id number] Date of Birth: [birth year]  Today's Date: 10/23/2012 Time: 1100-1200 OT Time Calculation (min): 60 min Manual Therapy 1100-1124 24' Therapeutic Exercises [phone] 21' Self Care [phone] 10' Visit#: 10 of 12  Re-eval: 11/13/12    Authorization: [employer] Medicare  Authorization Time Period: before 18th visit  Authorization Visit#: 10 of 19  Subjective Symptoms/Limitations Symptoms: S:  I think you are right, I have arthritis all over my body.  I have an appointment with a rheumatologist on 11/11/12.  I cant even turn the ingnition of my car because my hands hurt. Pain Assessment Currently in Pain?: Yes Pain Score:   5 Pain Orientation: Right;Left Pain Type: Chronic pain  Precautions/Restrictions     Exercise/Treatments Supine Protraction: Both;PROM;10 reps;AAROM;15 reps Horizontal ABduction: Both;PROM;10 reps;AAROM;15 reps External Rotation: Both;PROM;10 reps;AAROM;15 reps Internal Rotation: Both;PROM;10 reps;AAROM;15 reps Flexion: Both;PROM;10 reps;AAROM;15 reps    Modalities Modalities: paraffin to bilateral hands due to arthritic pain - no charge Manual Therapy Manual Therapy: Myofascial release Myofascial Release: MFR to bilateral upper arms, scapular, trapezius, rhomboid regions to decrease pain and fascial restrictions and increase pain free mobility Moist Heat Therapy Number Minutes Moist Heat: 15 Minutes Moist Heat Location: Shoulder Activities of Daily Living Activities of Daily Living: Discussed/educated patient on several different adaptive equipment ideas to increase her independence with ADLs.  Patient quite interested in a key holder for her car key.  I copied down ordering information for the patient and gave it to her.  Also recommended checking with [facility] and [facility] to see if they carry these items.   Occupational Therapy Assessment and Plan OT  Assessment and Plan Clinical Impression Statement: A:  Patient receptive to idea of using a key holder for her car ignition.  OT Plan: P: Attempt AAROM Seated and AROM supine.   Goals Short Term Goals Time to Complete Short Term Goals: 3 weeks Short Term Goal 1: Patient will be educated on HEP. Short Term Goal 2: Patient will increase her bilateral shoulders PROM to WNL for increased ability to don shirts and coats with less pain.  Short Term Goal 3: Patient will increase her bilateral shoulder pain to 5/10 when sleeping and be able to sleep for 2 hours comfortably. Short Term Goal 4: Patient will decrease fascial restrictions to min in her bilateral shoulder and cervical region for increased mobility needed to comb hair.  Long Term Goals Time to Complete Long Term Goals: 6 weeks Long Term Goal 1: Patient will return to prior level of independence with all functional, leisure, and desired acitivties. Long Term Goal 1 Progress: Progressing toward goal Long Term Goal 2: Patient will increase her bilateral shoulder AROM to WNL for increased ability to be able to perform household chores with decreased difficulty.  Long Term Goal 2 Progress: Progressing toward goal Long Term Goal 3: Patient will decrease her pain level to 1/10 when sleeping in bed. Long Term Goal 4: Patient will decrease fascial restrictions to trace for increased ability to comb hair.  Long Term Goal 4 Progress: Progressing toward goal  Problem List Patient Active Problem List   Diagnosis Date Noted  . Cervical arthritis 10/15/2012  . Arthritis/arthropathy of multiple joints 10/15/2012  . Muscle weakness (generalized) 09/12/2012  . Pain in joint, shoulder region 09/10/2012  . Rotator cuff syndrome 09/10/2012    End of Session Activity Tolerance: Patient tolerated treatment well General Behavior During Therapy: [facility] for tasks assessed/performed Cognition: [facility]  for tasks performed  GO    Jacqualine Code 10/23/2012, 1:50 PM

## 2012-10-24 ENCOUNTER — Ambulatory Visit (HOSPITAL_COMMUNITY)
Admission: RE | Admit: 2012-10-24 | Discharge: 2012-10-24 | Disposition: A | Discharge status: Home / Self Care | Payer: Medicare Other | Source: Ambulatory Visit | Attending: Specialist | Admitting: Specialist

## 2012-10-24 NOTE — Progress Notes (Signed)
Occupational Therapy Treatment Patient Details  Name: [name] MRN: [id number] Date of Birth: 1930-02-07  Today's Date: 10/24/2012 Time: 4401-0272 OT Time Calculation (min): 44 min Manual Therapy 1526-1550 24' Therapeutic exercises 1550-1610 20' Visit#: 11 of 16  Re-eval: 11/13/12    Authorization: [employer] Medicare  Authorization Time Period: before 19th visit  Authorization Visit#: 11 of 19  Subjective S:  I can close my hand now!  My shoulders feel much better too. Pain Assessment Currently in Pain?: Yes Pain Score:   3 Pain Location: Shoulder Pain Orientation: Right;Left Pain Type: Chronic pain  Precautions/Restrictions   n/a  Exercise/Treatments Supine Protraction: Both;PROM;AROM;10 reps Horizontal ABduction: Both;PROM;AROM;10 reps External Rotation: Both;PROM;AROM;10 reps Internal Rotation: Both;PROM;AROM;10 reps Flexion: Both;PROM;AROM;10 reps ABduction: Both;PROM;AROM;10 reps Standing Protraction: Theraband;15 reps Theraband Level (Shoulder Protraction): Level 2 (Red) Extension: Theraband;15 reps Theraband Level (Shoulder Extension): Level 2 (Red) Row: Theraband;15 reps Theraband Level (Shoulder Row): Level 2 (Red) ROM / Strengthening / Isometric Strengthening Proximal Shoulder Strengthening, Supine: 10X each exercise without resting between exercises.        Manual Therapy Manual Therapy: Myofascial release Myofascial Release: MFR to bilateral upper arms, scapular, trapezius, rhomboid regions to decrease pain and fascial restrictions and increase pain free mobility. Manual cervical traction and MFR to trapezius and Sternocleidomastoid musculature.   Occupational Therapy Assessment and Plan OT Assessment and Plan Clinical Impression Statement: A:  Significant decrease in pain from previous session.  Transitioned to AROM in supine, added theraband exercises for scapular stability. OT Plan: P:  Add AAROM in seated, when patient independent with theraband  exercises, add to HEP.   Goals Short Term Goals Time to Complete Short Term Goals: 3 weeks Short Term Goal 1: Patient will be educated on HEP. Short Term Goal 2: Patient will increase her bilateral shoulders PROM to WNL for increased ability to don shirts and coats with less pain.  Short Term Goal 3: Patient will increase her bilateral shoulder pain to 5/10 when sleeping and be able to sleep for 2 hours comfortably. Short Term Goal 4: Patient will decrease fascial restrictions to min in her bilateral shoulder and cervical region for increased mobility needed to comb hair.  Long Term Goals Time to Complete Long Term Goals: 6 weeks Long Term Goal 1: Patient will return to prior level of independence with all functional, leisure, and desired acitivties. Long Term Goal 2: Patient will increase her bilateral shoulder AROM to WNL for increased ability to be able to perform household chores with decreased difficulty.  Long Term Goal 3: Patient will decrease her pain level to 1/10 when sleeping in bed. Long Term Goal 4: Patient will decrease fascial restrictions to trace for increased ability to comb hair.   Problem List Patient Active Problem List   Diagnosis Date Noted  . Cervical arthritis 10/15/2012  . Arthritis/arthropathy of multiple joints 10/15/2012  . Muscle weakness (generalized) 09/12/2012  . Pain in joint, shoulder region 09/10/2012  . Rotator cuff syndrome 09/10/2012    End of Session Activity Tolerance: Patient tolerated treatment well General Behavior During Therapy: [facility] for tasks assessed/performed Cognition: WFL for tasks performed  GO    Shirlean Mylar, OTR/L  10/24/2012, 4:15 PM

## 2012-10-28 ENCOUNTER — Ambulatory Visit (HOSPITAL_COMMUNITY)
Admission: RE | Admit: 2012-10-28 | Discharge: 2012-10-28 | Disposition: A | Discharge status: Home / Self Care | Payer: Medicare Other | Source: Ambulatory Visit | Attending: Family Medicine | Admitting: Family Medicine

## 2012-10-28 NOTE — Progress Notes (Signed)
Occupational Therapy Treatment Patient Details  Name: [name] MRN: [id number] Date of Birth: Jun 21, 1929  Today's Date: 10/28/2012 Time: 1350-1440 OT Time Calculation (min): 50 min Manual Therapy 1350-1410 20' Therapeutic Exercises 1410-1440 30' Visit#: 12 of 16  Re-eval: 11/13/12    Authorization: BCBS Medicare  Authorization Time Period: before 19th visit  Authorization Visit#: 12 of 19  Subjective S:  I hurt in my knees more than my shoulders and I cant move my shoulders very well.  Pain Assessment Currently in Pain?: Yes Pain Score:   3 Pain Location: Shoulder Pain Orientation: Right;Left Pain Type: Chronic pain  Precautions/Restrictions   n/a  Exercise/Treatments Supine Protraction: PROM;10 reps;AROM;12 reps;Both Horizontal ABduction: Both;PROM;10 reps;AROM;12 reps External Rotation: Both;PROM;10 reps;AROM;12 reps Internal Rotation: Both;PROM;10 reps;AROM;12 reps Flexion: Both;PROM;10 reps;AROM;12 reps ABduction: PROM;10 reps;AROM;12 reps Therapy Ball Flexion: 20 reps ABduction: 20 reps ROM / Strengthening / Isometric Strengthening UBE (Upper Arm Bike): 2' and 2' 1.0 Wall Wash: begin next visit Proximal Shoulder Strengthening, Seated: begin next visit      Manual Therapy Manual Therapy: Myofascial release Myofascial Release: MFR to bilateral upper arms, scapular, trapezius, rhomboid regions to decrease pain and fascial restrictions and increase pain free mobility. Manual cervical traction and MFR to trapezius and Sternocleidomastoid musculature.   Occupational Therapy Assessment and Plan OT Assessment and Plan Clinical Impression Statement: A:  Able to complete 12 reps of AROM in supine through full range, except for abduction, which causes pain in her clavicle on the left side.  OT Plan: P:  Attempt AAROM/AROM seated, proximal shoulder strengthening in seated, and resume tband exercises for scapular stability.    Goals Short Term Goals Time to  Complete Short Term Goals: 3 weeks Short Term Goal 1: Patient will be educated on HEP. Short Term Goal 2: Patient will increase her bilateral shoulders PROM to WNL for increased ability to don shirts and coats with less pain.  Short Term Goal 3: Patient will increase her bilateral shoulder pain to 5/10 when sleeping and be able to sleep for 2 hours comfortably. Short Term Goal 4: Patient will decrease fascial restrictions to min in her bilateral shoulder and cervical region for increased mobility needed to comb hair.  Long Term Goals Time to Complete Long Term Goals: 6 weeks Long Term Goal 1: Patient will return to prior level of independence with all functional, leisure, and desired acitivties. Long Term Goal 1 Progress: Progressing toward goal Long Term Goal 2: Patient will increase her bilateral shoulder AROM to WNL for increased ability to be able to perform household chores with decreased difficulty.  Long Term Goal 2 Progress: Progressing toward goal Long Term Goal 3: Patient will decrease her pain level to 1/10 when sleeping in bed. Long Term Goal 3 Progress: Progressing toward goal Long Term Goal 4: Patient will decrease fascial restrictions to trace for increased ability to comb hair.  Long Term Goal 4 Progress: Progressing toward goal  Problem List Patient Active Problem List   Diagnosis Date Noted  . Cervical arthritis 10/15/2012  . Arthritis/arthropathy of multiple joints 10/15/2012  . Muscle weakness (generalized) 09/12/2012  . Pain in joint, shoulder region 09/10/2012  . Rotator cuff syndrome 09/10/2012    End of Session Activity Tolerance: Patient tolerated treatment well General Behavior During Therapy: [facility] for tasks assessed/performed Cognition: WFL for tasks performed   Shirlean Mylar, OTR/L  10/28/2012, 4:23 PM

## 2012-10-30 ENCOUNTER — Ambulatory Visit (HOSPITAL_COMMUNITY)
Admission: RE | Admit: 2012-10-30 | Discharge: 2012-10-30 | Disposition: A | Discharge status: Home / Self Care | Payer: Medicare Other | Source: Ambulatory Visit | Attending: Family Medicine | Admitting: Family Medicine

## 2012-10-30 NOTE — Progress Notes (Signed)
Occupational Therapy Treatment Patient Details  Name: ZAMYIA GOWELL MRN: [id number] Date of Birth: [birth year]  Today's Date: 10/30/2012 Time: 6962-9528 OT Time Calculation (min): 52 min MFR 1353-1410 17' Therex 4132-4401 35'  Visit#: 13 of 16  Re-eval: 11/13/12    Authorization: BCBS Medicare  Authorization Time Period: before 19th visit  Authorization Visit#: 13 of 19  Subjective Symptoms/Limitations Symptoms: S: My shoulders are so sore from last tx session.  Pain Assessment Currently in Pain?: Yes Pain Score:   8 Pain Location: Shoulder Pain Orientation: Right;Left Pain Type: Chronic pain  Precautions/Restrictions  Precautions Precautions: None  Exercise/Treatments Supine Protraction: PROM;10 reps;AROM;12 reps;Both Horizontal ABduction: Both;PROM;10 reps;AROM;12 reps External Rotation: Both;PROM;10 reps;AROM;12 reps Internal Rotation: Both;PROM;10 reps;AROM;12 reps Flexion: Both;PROM;10 reps;AROM;12 reps ABduction: PROM;10 reps;AROM;12 reps;Both Seated Protraction: AROM;10 reps Horizontal ABduction: AROM;10 reps External Rotation: AROM;10 reps Internal Rotation: AROM;10 reps Flexion: AROM;10 reps Abduction: AROM;10 reps ROM / Strengthening / Isometric Strengthening UBE (Upper Arm Bike): 1.0 3' forward 3' reverse      Manual Therapy Manual Therapy: Myofascial release Myofascial Release: MFR to bilateral upper arms, scapular, trapezius, rhomboid regions to decrease pain and fascial restrictions and increase pain free mobility. Manual cervical traction and MFR to trapezius and Sternocleidomastoid musculature.  Occupational Therapy Assessment and Plan OT Assessment and Plan Clinical Impression Statement: A: pt completed all 12 repitions of supine AROM exercises. With vc's for form and technique. Added seated AROM exercises. Patient tolerated well. Increased time on UBE bike without difficulty.  OT Plan: P:  Add proximal shoulder strengthening in seated, and  resume tband exercises for scapular stability.    Goals Short Term Goals Time to Complete Short Term Goals: 3 weeks Short Term Goal 1: Patient will be educated on HEP. Short Term Goal 2: Patient will increase her bilateral shoulders PROM to WNL for increased ability to don shirts and coats with less pain.  Short Term Goal 3: Patient will increase her bilateral shoulder pain to 5/10 when sleeping and be able to sleep for 2 hours comfortably. Short Term Goal 4: Patient will decrease fascial restrictions to min in her bilateral shoulder and cervical region for increased mobility needed to comb hair.  Long Term Goals Time to Complete Long Term Goals: 6 weeks Long Term Goal 1: Patient will return to prior level of independence with all functional, leisure, and desired acitivties. Long Term Goal 2: Patient will increase her bilateral shoulder AROM to WNL for increased ability to be able to perform household chores with decreased difficulty.  Long Term Goal 3: Patient will decrease her pain level to 1/10 when sleeping in bed. Long Term Goal 4: Patient will decrease fascial restrictions to trace for increased ability to comb hair.   Problem List Patient Active Problem List   Diagnosis Date Noted  . Cervical arthritis 10/15/2012  . Arthritis/arthropathy of multiple joints 10/15/2012  . Muscle weakness (generalized) 09/12/2012  . Pain in joint, shoulder region 09/10/2012  . Rotator cuff syndrome 09/10/2012    End of Session Activity Tolerance: Patient tolerated treatment well General Behavior During Therapy: [facility] for tasks assessed/performed Cognition: [facility] for tasks performed   Limmie Patricia, OTR/L,CBIS   10/30/2012, 2:42 PM

## 2012-11-04 ENCOUNTER — Ambulatory Visit (HOSPITAL_COMMUNITY)
Admission: RE | Admit: 2012-11-04 | Discharge: 2012-11-04 | Disposition: A | Discharge status: Home / Self Care | Payer: Medicare Other | Source: Ambulatory Visit | Attending: Family Medicine | Admitting: Family Medicine

## 2012-11-04 NOTE — Progress Notes (Signed)
Occupational Therapy Treatment Patient Details  Name: [name] MRN: [id number] Date of Birth: 09-05-1929  Today's Date: 11/04/2012 Time: 4098-1191 OT Time Calculation (min): 51 min MFR 4782-9562 24' Therex 1308-6578 27'  Visit#: 14 of 16  Re-eval: 11/13/12    Authorization: BC[employer] Medicare  Authorization Time Period: before 19th visit  Authorization Visit#: 14 of 19  Subjective Symptoms/Limitations Symptoms: S: I woke up this morning and my back was hurting. I don't understand why since I slept in the same position all night.  Pain Assessment Currently in Pain?: Yes Pain Score:   5 Pain Location: Hand Pain Orientation: Right Pain Type: Chronic pain  Precautions/Restrictions  Precautions Precautions: None  Exercise/Treatments Supine Protraction: PROM;10 reps;AROM;12 reps;Both Horizontal ABduction: Both;PROM;10 reps;AROM;12 reps External Rotation: Both;PROM;10 reps;AROM;12 reps Internal Rotation: Both;PROM;10 reps;AROM;12 reps Flexion: Both;PROM;10 reps;AROM;12 reps ABduction: PROM;10 reps;AROM;12 reps;Both ROM / Strengthening / Isometric Strengthening UBE (Upper Arm Bike): 1.0 3' forward 3' reverse      Manual Therapy Manual Therapy: Myofascial release Myofascial Release: MFR to bilateral upper arms, scapular, trapezius, rhomboid regions to decrease pain and fascial restrictions and increase pain free mobility. Manual cervical traction and MFR to trapezius and Sternocleidomastoid musculature.  Occupational Therapy Assessment and Plan OT Assessment and Plan Clinical Impression Statement: A: Did not complete all exercises due to time frame. Patient performed AROM supine with great form.  OT Plan: P:  Add proximal shoulder strengthening in seated, and resume tband exercises for scapular stability.    Goals Short Term Goals Time to Complete Short Term Goals: 3 weeks Short Term Goal 1: Patient will be educated on HEP. Short Term Goal 2: Patient will increase her  bilateral shoulders PROM to WNL for increased ability to don shirts and coats with less pain.  Short Term Goal 3: Patient will increase her bilateral shoulder pain to 5/10 when sleeping and be able to sleep for 2 hours comfortably. Short Term Goal 4: Patient will decrease fascial restrictions to min in her bilateral shoulder and cervical region for increased mobility needed to comb hair.  Long Term Goals Time to Complete Long Term Goals: 6 weeks Long Term Goal 1: Patient will return to prior level of independence with all functional, leisure, and desired acitivties. Long Term Goal 1 Progress: Progressing toward goal Long Term Goal 2: Patient will increase her bilateral shoulder AROM to WNL for increased ability to be able to perform household chores with decreased difficulty.  Long Term Goal 2 Progress: Progressing toward goal Long Term Goal 3: Patient will decrease her pain level to 1/10 when sleeping in bed. Long Term Goal 3 Progress: Progressing toward goal Long Term Goal 4: Patient will decrease fascial restrictions to trace for increased ability to comb hair.  Long Term Goal 4 Progress: Progressing toward goal  Problem List Patient Active Problem List   Diagnosis Date Noted  . Cervical arthritis 10/15/2012  . Arthritis/arthropathy of multiple joints 10/15/2012  . Muscle weakness (generalized) 09/12/2012  . Pain in joint, shoulder region 09/10/2012  . Rotator cuff syndrome 09/10/2012    End of Session Activity Tolerance: Patient tolerated treatment well General Behavior During Therapy: [facility] for tasks assessed/performed Cognition: [facility] for tasks performed   Limmie Patricia, OTR/L,CBIS   11/04/2012, 3:03 PM

## 2012-11-06 ENCOUNTER — Ambulatory Visit (HOSPITAL_COMMUNITY)
Admission: RE | Admit: 2012-11-06 | Discharge: 2012-11-06 | Disposition: A | Discharge status: Home / Self Care | Payer: Medicare Other | Source: Ambulatory Visit | Attending: Orthopedic Surgery | Admitting: Orthopedic Surgery

## 2012-11-06 DIAGNOSIS — M25619 Stiffness of unspecified shoulder, not elsewhere classified: Secondary | ICD-10-CM | POA: Insufficient documentation

## 2012-11-06 DIAGNOSIS — IMO0001 Reserved for inherently not codable concepts without codable children: Secondary | ICD-10-CM | POA: Insufficient documentation

## 2012-11-06 DIAGNOSIS — M25519 Pain in unspecified shoulder: Secondary | ICD-10-CM | POA: Insufficient documentation

## 2012-11-06 DIAGNOSIS — M6281 Muscle weakness (generalized): Secondary | ICD-10-CM | POA: Insufficient documentation

## 2012-11-06 NOTE — Progress Notes (Signed)
Occupational Therapy Treatment Patient Details  Name: ANAE HAMS MRN: [id number] Date of Birth: Mar 21, 1930  Today's Date: 11/06/2012 Time: 7829-5621 OT Time Calculation (min): 55 min Manual Therapy 1350-1420 30' Therapeutic Exercises 1420-1445 25' Visit#: 15 of 16  Re-eval: 11/13/12    Authorization: [employer] Medicare  Authorization Time Period: before 19th visit  Authorization Visit#: 15 of 19  Subjective S:  I am going to the rheumatologist next week.  I am looking forward to hearing what he has to say. Pain Assessment Currently in Pain?: Yes Pain Score: 4  Pain Location: Shoulder Pain Orientation: Right;Left Pain Type: Chronic pain  Precautions/Restrictions   progress as tolerated  Exercise/Treatments Supine Protraction: PROM;10 reps;Both Horizontal ABduction: PROM;Both;10 reps External Rotation: PROM;Both;10 reps Internal Rotation: PROM;Both;10 reps Flexion: PROM;Both;10 reps ABduction: PROM;10 reps;Both Seated Protraction: AROM;15 reps Horizontal ABduction: AROM;15 reps External Rotation: AROM;15 reps Internal Rotation: AROM;15 reps Flexion: AROM;15 reps Abduction: AROM;15 reps Therapy Ball Flexion: 20 reps ABduction: 20 reps ROM / Strengthening / Isometric Strengthening UBE (Upper Arm Bike): 3' and 3' 1.5 intensity  Wall Wash: 1'  "W" Arms: 5 X, fatiqued quickly  X to V Arms: 10 X with min facilitation for technique Proximal Shoulder Strengthening, Seated: 10X each resting after first 2 exercises      Manual Therapy Manual Therapy: Myofascial release Myofascial Release: MFR to bilateral upper arms, scapular, trapezius, rhomboid regions to decrease pain and fascial restrictions and increase pain free mobility. Manual cervical traction and MFR to trapezius and Sternocleidomastoid musculature.  Occupational Therapy Assessment and Plan OT Assessment and Plan Clinical Impression Statement: A:  Added proximal shoulder strengthening in seated, x to v and w  arms for increased scapular stablity.   OT Plan: P:  REASSESS    Goals Short Term Goals Time to Complete Short Term Goals: 3 weeks Short Term Goal 1: Patient will be educated on HEP. Short Term Goal 2: Patient will increase her bilateral shoulders PROM to WNL for increased ability to don shirts and coats with less pain.  Short Term Goal 3: Patient will increase her bilateral shoulder pain to 5/10 when sleeping and be able to sleep for 2 hours comfortably. Short Term Goal 4: Patient will decrease fascial restrictions to min in her bilateral shoulder and cervical region for increased mobility needed to comb hair.  Long Term Goals Time to Complete Long Term Goals: 6 weeks Long Term Goal 1: Patient will return to prior level of independence with all functional, leisure, and desired acitivties. Long Term Goal 1 Progress: Progressing toward goal Long Term Goal 2: Patient will increase her bilateral shoulder AROM to WNL for increased ability to be able to perform household chores with decreased difficulty.  Long Term Goal 2 Progress: Progressing toward goal Long Term Goal 3: Patient will decrease her pain level to 1/10 when sleeping in bed. Long Term Goal 3 Progress: Progressing toward goal Long Term Goal 4: Patient will decrease fascial restrictions to trace for increased ability to comb hair.  Long Term Goal 4 Progress: Progressing toward goal  Problem List Patient Active Problem List   Diagnosis Date Noted  . Cervical arthritis 10/15/2012  . Arthritis/arthropathy of multiple joints 10/15/2012  . Muscle weakness (generalized) 09/12/2012  . Pain in joint, shoulder region 09/10/2012  . Rotator cuff syndrome 09/10/2012    End of Session Activity Tolerance: Patient tolerated treatment well General Behavior During Therapy: WFL for tasks assessed/performed Cognition: WFL for tasks performed  GO    Shirlean Mylar, OTR/L  11/06/2012, 2:52 PM

## 2012-11-11 ENCOUNTER — Ambulatory Visit (HOSPITAL_COMMUNITY): Payer: Medicare Other

## 2012-11-13 ENCOUNTER — Ambulatory Visit (HOSPITAL_COMMUNITY)
Admission: RE | Admit: 2012-11-13 | Discharge: 2012-11-13 | Disposition: A | Discharge status: Home / Self Care | Payer: Medicare Other | Source: Ambulatory Visit | Attending: Family Medicine | Admitting: Family Medicine

## 2012-11-13 NOTE — Evaluation (Signed)
Occupational Therapy Re-Evaluation  Patient Details  Name: [name] MRN: [id number] Date of Birth: 08/15/1929  Today's Date: 11/13/2012 Time: 0347-4259 OT Time Calculation (min): 52 min MFR 5638-7564 23' Reassess 3329-5188 29'  Visit#: 16 of 16  Re-eval: 11/13/12     Authorization: [employer]BS Medicare  Authorization Time Period:    Authorization Visit#: 16 of 19   Past Medical History:  Past Medical History  Diagnosis Date  . Hypertension   . Arthritis   . Cancer     melanoma of nose   Past Surgical History:  Past Surgical History  Procedure Laterality Date  . Abdominal hysterectomy      1982  . Cesarean section      x4  . Tonsillectomy      age 35  . Appendectomy      with 1 c-section  . Cataract extraction w/phaco  07/31/2011    Procedure: CATARACT EXTRACTION PHACO AND INTRAOCULAR LENS PLACEMENT (IOC);  Surgeon: Gemma Payor, MD;  Location: [facility];  Service: Ophthalmology;  Laterality: Right;  CDE: 11.31  . Cataract extraction w/phaco  08/14/2011    Procedure: CATARACT EXTRACTION PHACO AND INTRAOCULAR LENS PLACEMENT (IOC);  Surgeon: Gemma Payor, MD;  Location: [facility];  Service: Ophthalmology;  Laterality: Left;  CDE: 10.25    Subjective Symptoms/Limitations Symptoms: S: My shoulders don't hurt unless I move them.  Pain Assessment Currently in Pain?: No/denies  Precautions/Restrictions  Precautions Precautions: None   Assessment Additional Assessments RUE AROM (degrees) RUE Overall AROM Comments: assessed seated. IR/ER ADD Right Shoulder Flexion: 168 Degrees (last progress note: 160) Right Shoulder ABduction: 175 Degrees (last progress note: 130) Right Shoulder Internal Rotation: 90 Degrees (saem) Right Shoulder External Rotation: 90 Degrees (same) RUE Strength Right Shoulder Flexion: 5/5 Right Shoulder ABduction: 5/5 Right Shoulder Internal Rotation: 5/5 Right Shoulder External Rotation: 5/5 LUE AROM (degrees) Left Shoulder Flexion: 163 Degrees (last  progress note: 150) Left Shoulder ABduction: 175 Degrees (last progress note: 99) Left Shoulder Internal Rotation: 90 Degrees (same) Left Shoulder External Rotation: 90 Degrees (same) LUE Strength Left Shoulder Flexion: 5/5 Left Shoulder ABduction: 5/5 Left Shoulder Internal Rotation: 5/5 Left Shoulder External Rotation: 5/5     Exercise/Treatments    Manual Therapy Manual Therapy: Myofascial release Myofascial Release: MFR to bilateral upper arms, scapular, trapezius, rhomboid regions to decrease pain and fascial restrictions and increase pain free mobility. Manual cervical traction and MFR to trapezius and Sternocleidomastoid musculature  Occupational Therapy Assessment and Plan OT Assessment and Plan Clinical Impression Statement: A: See MD note for progress. Patient has met all short term and long tmer goals and is ready for D/C.  OT Plan: P: D/C from therapy.    Goals Short Term Goals Time to Complete Short Term Goals: 3 weeks Short Term Goal 1: Patient will be educated on HEP. Short Term Goal 2: Patient will increase her bilateral shoulders PROM to WNL for increased ability to don shirts and coats with less pain.  Short Term Goal 3: Patient will increase her bilateral shoulder pain to 5/10 when sleeping and be able to sleep for 2 hours comfortably. Short Term Goal 4: Patient will decrease fascial restrictions to min in her bilateral shoulder and cervical region for increased mobility needed to comb hair.  Long Term Goals Time to Complete Long Term Goals: 6 weeks Long Term Goal 1: Patient will return to prior level of independence with all functional, leisure, and desired acitivties. Long Term Goal 1 Progress: Met Long Term Goal 2: Patient  will increase her bilateral shoulder AROM to WNL for increased ability to be able to perform household chores with decreased difficulty.  Long Term Goal 2 Progress: Met Long Term Goal 3: Patient will decrease her pain level to 1/10 when  sleeping in bed. Long Term Goal 3 Progress: Met Long Term Goal 4: Patient will decrease fascial restrictions to trace for increased ability to comb hair.  Long Term Goal 4 Progress: Met  Problem List Patient Active Problem List   Diagnosis Date Noted  . Cervical arthritis 10/15/2012  . Arthritis/arthropathy of multiple joints 10/15/2012  . Muscle weakness (generalized) 09/12/2012  . Pain in joint, shoulder region 09/10/2012  . Rotator cuff syndrome 09/10/2012    End of Session Activity Tolerance: Patient tolerated treatment well General Behavior During Therapy: WFL for tasks assessed/performed Cognition: WFL for tasks performed OT Plan of Care OT Home Exercise Plan: AROM exercises OT Patient Instructions: handout - scanned Consulted and Agree with Plan of Care: Patient  GO Functional Assessment Tool Used: DASH score of 6.8 with ideal score of 0. (Last progress note: 31.8) Functional Limitation: Carrying, moving and handling objects Carrying, Moving and Handling Objects Current Status [phone]): At least 1 percent but less than 20 percent impaired, limited or restricted Carrying, Moving and Handling Objects Goal Status (U0454): 0 percent impaired, limited or restricted Carrying, Moving and Handling Objects Discharge Status [phone]): At least 1 percent but less than 20 percent impaired, limited or restricted  Limmie Patricia, OTR/L,CBIS   11/13/2012, 2:46 PM  Physician Documentation Your signature is required to indicate approval of the treatment plan as stated above.  Please sign and either send electronically or make a copy of this report for your files and return this physician signed original.  Please mark one 1.__approve of plan  2. ___approve of plan with the following conditions.   ______________________________                                                          _____________________ Physician Signature                                                                                                              Date

## 2012-11-15 ENCOUNTER — Ambulatory Visit (HOSPITAL_COMMUNITY): Payer: Medicare Other | Admitting: Specialist

## 2013-06-13 ENCOUNTER — Emergency Department (HOSPITAL_COMMUNITY): Payer: Medicare HMO

## 2013-06-13 ENCOUNTER — Encounter (HOSPITAL_COMMUNITY): Payer: Self-pay | Admitting: Emergency Medicine

## 2013-06-13 ENCOUNTER — Inpatient Hospital Stay (HOSPITAL_COMMUNITY)
Admission: EM | Admit: 2013-06-13 | Discharge: 2013-06-19 | DRG: 065 | Disposition: A | Discharge status: Skilled Nursing Facility | Payer: Medicare HMO | Attending: Neurology | Admitting: Neurology

## 2013-06-13 DIAGNOSIS — Z7983 Long term (current) use of bisphosphonates: Secondary | ICD-10-CM

## 2013-06-13 DIAGNOSIS — R339 Retention of urine, unspecified: Secondary | ICD-10-CM | POA: Diagnosis present

## 2013-06-13 DIAGNOSIS — I10 Essential (primary) hypertension: Secondary | ICD-10-CM | POA: Diagnosis present

## 2013-06-13 DIAGNOSIS — I619 Nontraumatic intracerebral hemorrhage, unspecified: Principal | ICD-10-CM | POA: Diagnosis present

## 2013-06-13 DIAGNOSIS — Z85820 Personal history of malignant melanoma of skin: Secondary | ICD-10-CM

## 2013-06-13 DIAGNOSIS — Z791 Long term (current) use of non-steroidal anti-inflammatories (NSAID): Secondary | ICD-10-CM

## 2013-06-13 DIAGNOSIS — R4701 Aphasia: Secondary | ICD-10-CM | POA: Diagnosis present

## 2013-06-13 DIAGNOSIS — Z79899 Other long term (current) drug therapy: Secondary | ICD-10-CM

## 2013-06-13 DIAGNOSIS — M129 Arthropathy, unspecified: Secondary | ICD-10-CM | POA: Diagnosis present

## 2013-06-13 LAB — TYPE AND SCREEN
ABO/RH(D): O NEG
Antibody Screen: POSITIVE
DAT, IgG: NEGATIVE

## 2013-06-13 LAB — COMPREHENSIVE METABOLIC PANEL
ALT: 15 U/L (ref 0–35)
AST: 20 U/L (ref 0–37)
Albumin: 3.9 g/dL (ref 3.5–5.2)
Alkaline Phosphatase: 41 U/L (ref 39–117)
BUN: 16 mg/dL (ref 6–23)
CO2: 25 mEq/L (ref 19–32)
Calcium: 9 mg/dL (ref 8.4–10.5)
Chloride: 104 mEq/L (ref 96–112)
Creatinine, Ser: 0.91 mg/dL (ref 0.50–1.10)
GFR calc Af Amer: 67 mL/min — ABNORMAL LOW (ref >90)
GFR calc non Af Amer: 58 mL/min — ABNORMAL LOW (ref >90)
Glucose, Bld: 95 mg/dL (ref 70–99)
Potassium: 3.9 mEq/L (ref 3.7–5.3)
Sodium: 141 mEq/L (ref 137–147)
Total Bilirubin: 0.6 mg/dL (ref 0.3–1.2)
Total Protein: 7.2 g/dL (ref 6.0–8.3)

## 2013-06-13 LAB — URINALYSIS, ROUTINE W REFLEX MICROSCOPIC
Bilirubin Urine: NEGATIVE
Glucose, UA: NEGATIVE mg/dL
Hgb urine dipstick: NEGATIVE
Ketones, ur: 40 mg/dL — AB
Leukocytes, UA: NEGATIVE
Nitrite: NEGATIVE
Protein, ur: NEGATIVE mg/dL
Specific Gravity, Urine: 1.017 (ref 1.005–1.030)
Urobilinogen, UA: 0.2 mg/dL (ref 0.0–1.0)
pH: 7.5 (ref 5.0–8.0)

## 2013-06-13 LAB — MRSA PCR SCREENING: MRSA BY PCR: NEGATIVE

## 2013-06-13 LAB — CBC WITH DIFFERENTIAL/PLATELET
Basophils Absolute: 0 10*3/uL (ref 0.0–0.1)
Basophils Relative: 0 % (ref 0–1)
Eosinophils Absolute: 0.1 10*3/uL (ref 0.0–0.7)
Eosinophils Relative: 1 % (ref 0–5)
HCT: 37.2 % (ref 36.0–46.0)
Hemoglobin: 12.3 g/dL (ref 12.0–15.0)
Lymphocytes Relative: 9 % — ABNORMAL LOW (ref 12–46)
Lymphs Abs: 0.7 10*3/uL (ref 0.7–4.0)
MCH: 33.6 pg (ref 26.0–34.0)
MCHC: 33.1 g/dL (ref 30.0–36.0)
MCV: 101.6 fL — ABNORMAL HIGH (ref 78.0–100.0)
Monocytes Absolute: 0.7 10*3/uL (ref 0.1–1.0)
Monocytes Relative: 9 % (ref 3–12)
Neutro Abs: 6.2 10*3/uL (ref 1.7–7.7)
Neutrophils Relative %: 81 % — ABNORMAL HIGH (ref 43–77)
Platelets: 199 10*3/uL (ref 150–400)
RBC: 3.66 MIL/uL — ABNORMAL LOW (ref 3.87–5.11)
RDW: 15.3 % (ref 11.5–15.5)
WBC: 7.7 10*3/uL (ref 4.0–10.5)

## 2013-06-13 LAB — PROTIME-INR
INR: 0.96 (ref 0.00–1.49)
Prothrombin Time: 12.6 seconds (ref 11.6–15.2)

## 2013-06-13 LAB — APTT: aPTT: 31 seconds (ref 24–37)

## 2013-06-13 MED ORDER — ACETAMINOPHEN 650 MG RE SUPP
650.0000 mg | RECTAL | Status: DC | PRN
  Administered 2013-06-16: 650 mg via RECTAL
  Filled 2013-06-13: qty 1

## 2013-06-13 MED ORDER — SODIUM CHLORIDE 0.9 % IV SOLN
INTRAVENOUS | Status: DC
  Administered 2013-06-13 – 2013-06-16 (×5): via INTRAVENOUS

## 2013-06-13 MED ORDER — ONDANSETRON HCL 4 MG/2ML IJ SOLN
INTRAMUSCULAR | Status: AC
  Filled 2013-06-13: qty 2

## 2013-06-13 MED ORDER — PANTOPRAZOLE SODIUM 40 MG IV SOLR
40.0000 mg | Freq: Every day | INTRAVENOUS | Status: DC
  Administered 2013-06-13: 40 mg via INTRAVENOUS
  Filled 2013-06-13 (×2): qty 40

## 2013-06-13 MED ORDER — TRAMADOL HCL 50 MG PO TABS: 50.0000 mg | ORAL_TABLET | Freq: Four times a day (QID) | ORAL | Status: DC | PRN

## 2013-06-13 MED ORDER — SENNOSIDES-DOCUSATE SODIUM 8.6-50 MG PO TABS
1.0000 | ORAL_TABLET | Freq: Two times a day (BID) | ORAL | Status: DC
  Administered 2013-06-14 – 2013-06-19 (×7): 1 via ORAL
  Filled 2013-06-13 (×11): qty 1

## 2013-06-13 MED ORDER — ACETAMINOPHEN 325 MG PO TABS
650.0000 mg | ORAL_TABLET | ORAL | Status: DC | PRN
  Administered 2013-06-17: 650 mg via ORAL
  Filled 2013-06-13 (×2): qty 2

## 2013-06-13 MED ORDER — ONDANSETRON HCL 4 MG/2ML IJ SOLN: 4.0000 mg | Freq: Once | INTRAMUSCULAR | Status: DC

## 2013-06-13 MED ORDER — LABETALOL HCL 5 MG/ML IV SOLN: 10.0000 mg | INTRAVENOUS | Status: DC | PRN

## 2013-06-13 NOTE — ED Notes (Signed)
Lab at the bedside 

## 2013-06-13 NOTE — ED Provider Notes (Signed)
CSN: [id number]     Arrival date & time 06/13/13  1251 History  This chart was scribed for Virgel Manifold, MD by Elby Beck, ED Scribe. This patient was seen in room [facility] and the patient's care was started at 1:23 PM.   Chief Complaint  Patient presents with  . Weakness    The history is provided by the patient. No language interpreter was used.    LEVEL 5 CAVEAT (Pt is non-verbal)   HPI Comments: [name] is a 80 y.o. female with a history of HTN who was dropped off by her son at the Emergency Department complaining of generalized weakness over the past 2 days. Unknown when exactly last normal. Has seemed to be confused. 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 Normally conversant and pleasant. By shaking head and squeezing fingers to indicate "yes" or "no", she denies chest pain, abdominal pain, neck pain, back pain, headache or any other pain or symptoms.    Past Medical History  Diagnosis Date  . Hypertension   . Arthritis   . Cancer     melanoma of nose   Past Surgical History  Procedure Laterality Date  . Abdominal hysterectomy      1982  . Cesarean section      x4  . Tonsillectomy      age 43  . Appendectomy      with 1 c-section  . Cataract extraction w/phaco  07/31/2011    Procedure: CATARACT EXTRACTION PHACO AND INTRAOCULAR LENS PLACEMENT (IOC);  Surgeon: Tonny Branch, MD;  Location: [facility];  Service: Ophthalmology;  Laterality: Right;  CDE: 11.31  . Cataract extraction w/phaco  08/14/2011    Procedure: CATARACT EXTRACTION PHACO AND INTRAOCULAR LENS PLACEMENT (IOC);  Surgeon: Tonny Branch, MD;  Location: [facility];  Service: Ophthalmology;  Laterality: Left;  CDE: 10.25   Family History  Problem Relation Age of Onset  . Anesthesia problems Neg Hx   . Hypotension Neg Hx   . Malignant hyperthermia Neg Hx   . Pseudochol deficiency Neg Hx    History  Substance Use Topics  . Smoking status: Never Smoker   . Smokeless tobacco: Never Used  .  Alcohol Use: Yes     Comment: socially   OB History   Grav Para Term Preterm Abortions TAB SAB Ect Mult Living   4 4 4       4      Review of Systems  Unable to perform ROS: Patient nonverbal   Allergies  Review of patient's allergies indicates no known allergies.  Home Medications   Current Outpatient Rx  Name  Route  Sig  Dispense  Refill  . acetaminophen (TYLENOL) 500 MG tablet   Oral   Take 500 mg by mouth every 6 (six) hours as needed. For pain         . alendronate (FOSAMAX) 35 MG tablet   Oral   Take 35 mg by mouth every 7 (seven) days. Takes on Sundays/Mondays         . clobetasol cream (TEMOVATE) 0.05 %   Topical   Apply 1 application topically 2 (two) times daily as needed. For rash         . FIBER SELECT GUMMIES CHEW   Oral   Chew 1 tablet by mouth daily.         . flintstones complete (FLINTSTONES) 60 MG chewable tablet   Oral   Chew 2 tablets by mouth daily.         Marland Kitchen  lisinopril (PRINIVIL,ZESTRIL) 5 MG tablet   Oral   Take 5 mg by mouth at bedtime.          . naproxen (NAPROSYN) 250 MG tablet               . oxyCODONE-acetaminophen (PERCOCET/ROXICET) 5-325 MG per tablet   Oral   Take 1 tablet by mouth every 4 (four) hours as needed for pain.   15 tablet   0   . predniSONE (DELTASONE) 10 MG tablet   Oral   Take 2 tablets (20 mg total) by mouth daily.   10 tablet   0   . triamcinolone ointment (KENALOG) 0.1 %   Topical   Apply 1 application topically 2 (two) times daily as needed. For rash          Triage Vitals: BP 140/69  Pulse 80  Temp(Src) 97.6 F (36.4 C) (Oral)  Resp 18  Ht 5\' 5"  (1.651 m)  Wt 183 lb (83.008 kg)  BMI 30.45 kg/m2  SpO2 98%  Physical Exam  Nursing note and vitals reviewed. Constitutional: She appears well-developed and well-nourished. No distress.  Pt is not verbal. She can hum and clear her throat.   HENT:  Head: Normocephalic and atraumatic.  Eyes: EOM are normal.  Neck: Normal range of  motion.  Cardiovascular: Normal rate, regular rhythm and normal heart sounds.   Pulmonary/Chest: Effort normal and breath sounds normal.  Abdominal: Soft. She exhibits no distension. There is no tenderness.  Musculoskeletal: Normal range of motion.  Neurological: She is alert. She has normal strength. No cranial nerve deficit or sensory deficit. She exhibits normal muscle tone.  Oriented to self, place and month/year. Seems to have difficultly in initiating speech. Phonation appears intact. Able to clear throat, hum and able to repeat some words. Unable to speak more than a single word at a time consistently or repeat the same word rapidly. Does not sound dysarthric. Was able to write both her first and last name. Follows commands. Strength 5/5 b/l u/l extremities. CN intact.   Skin: Skin is warm and dry.  Psychiatric: She has a normal mood and affect. Judgment normal.    ED Course  Procedures (including critical care time)  CRITICAL CARE Performed by: Virgel Manifold  Total critical care time: 35 minutes  Critical care time was exclusive of separately billable procedures and treating other patients. Critical care was necessary to treat or prevent imminent or life-threatening deterioration. Critical care was time spent personally by me on the following activities: development of treatment plan with patient and/or surrogate as well as nursing, discussions with consultants, evaluation of patient's response to treatment, examination of patient, obtaining history from patient or surrogate, ordering and performing treatments and interventions, ordering and review of laboratory studies, ordering and review of radiographic studies, pulse oximetry and re-evaluation of patient's condition.   DIAGNOSTIC STUDIES: Oxygen Saturation is 98% on RA, normal by my interpretation.    COORDINATION OF CARE: 1:28 PM- Pt advised of plan for treatment and pt agrees.  Labs Review Labs Reviewed  URINALYSIS,  ROUTINE W REFLEX MICROSCOPIC - Abnormal; Notable for the following:    APPearance CLOUDY (*)    Ketones, ur 40 (*)    All other components within normal limits  COMPREHENSIVE METABOLIC PANEL - Abnormal; Notable for the following:    GFR calc non Af Amer 58 (*)    GFR calc Af Amer 67 (*)    All other components within normal limits  CBC  WITH DIFFERENTIAL - Abnormal; Notable for the following:    RBC 3.66 (*)    MCV 101.6 (*)    Neutrophils Relative % 81 (*)    Lymphocytes Relative 9 (*)    All other components within normal limits  MRSA PCR SCREENING  APTT  PROTIME-INR  TYPE AND SCREEN   Imaging Review Ct Head Wo Contrast  06/13/2013   CLINICAL DATA:  Expressive aphasia.  EXAM: CT HEAD WITHOUT CONTRAST  TECHNIQUE: Contiguous axial images were obtained from the base of the skull through the vertex without intravenous contrast.  COMPARISON:  None.  FINDINGS: There is a left frontal lobe hemorrhage adjacent to the falx measuring 45 mm x 19 mm. Mass effect is mild with surrounding edema. Edema may be cytotoxic or vasogenic. There is edema extending into the corpus callosum. There is underlying atrophy and chronic ischemic white matter disease. Small amount of hemorrhage breaks through into the lateral ventricles with blood layering in both lateral ventricles (image 17 series 2). Posterior fossa structures appear within normal limits. Intracranial atherosclerosis is mild. There may also be a tiny amount of breakthrough subdural blood along the anterior falx (image number 24).  There is no hydrocephalus. No other mass lesions are identified. Paranasal sinuses appear within normal limits and calvarium is intact.  IMPRESSION: 1. Left frontal parenchymal hemorrhage adjacent to the falx. This is favored to represent hemorrhagic infarction however an underlying neoplasm is difficult to exclude based on the edema and mass effect extending into the genu of the corpus callosum. 2. Critical Value/emergent  results were called by telephone at the time of interpretation on 06/13/2013 at 3:03 PM to Dr. Christy Gentles , who verbally acknowledged these results.   Electronically Signed   By: Dereck Ligas M.D.   On: 06/13/2013 15:04        MDM   1. Hemorrhagic stroke     83yF who appears to have Broca's aphasia. Unknown when last seen normal. Neuro exam otherwise seems intact. Unfortunately not much additional history at this time, but new deficits per staff who know her. Plan stroke w/u.  CT significant for L frontal cerebral hemorrhage. No blood thinners per med list. Past hx of melanoma on nose. HD stable. Discussed with neurology. Transfer to [facility].   I personally preformed the services scribed in my presence. The recorded information has been reviewed is accurate. Virgel Manifold, MD.   Virgel Manifold, MD 06/13/13 2222

## 2013-06-13 NOTE — ED Notes (Signed)
[facility] here for pt, pt will follow command bilaterally, upper and lower extremities, pupils 2 equal bilaterally

## 2013-06-13 NOTE — ED Notes (Signed)
[facility] called to page Neurology at [facility] for admit.

## 2013-06-13 NOTE — Consult Note (Signed)
Admission H&P    Chief Complaint: New-onset speech difficulty.  HPI: [name] is an 80 y.o. female a history of hypertension and arthritis who presented to the emergency room at [facility] with inability to speak. Onset is unclear. Patient apparently has been symptomatic for past 2 days with speech difficulty and apparent confusion. Has no previous history of stroke nor TIA. She has not been on antiplatelet therapy. CT scan of the head showed left parenchymal hemorrhage adjacent to the falx. There was extension of hemorrhage into left and right lateral ventricles. This was resumed hemorrhagic infarction. However, mass lesion cannot be ruled out. Her NIH stroke score was 8. Patient was transferred to [facility] for further management.  LSN: Unclear tPA Given: No: ICH MRankin: 2  Past Medical History  Diagnosis Date  . Hypertension   . Arthritis   . Cancer     melanoma of nose    Past Surgical History  Procedure Laterality Date  . Abdominal hysterectomy      1982  . Cesarean section      x4  . Tonsillectomy      age 77  . Appendectomy      with 1 c-section  . Cataract extraction w/phaco  07/31/2011    Procedure: CATARACT EXTRACTION PHACO AND INTRAOCULAR LENS PLACEMENT (IOC);  Surgeon: Tonny Branch, MD;  Location: [facility];  Service: Ophthalmology;  Laterality: Right;  CDE: 11.31  . Cataract extraction w/phaco  08/14/2011    Procedure: CATARACT EXTRACTION PHACO AND INTRAOCULAR LENS PLACEMENT (IOC);  Surgeon: Tonny Branch, MD;  Location: [facility];  Service: Ophthalmology;  Laterality: Left;  CDE: 10.25    Family History  Problem Relation Age of Onset  . Anesthesia problems Neg Hx   . Hypotension Neg Hx   . Malignant hyperthermia Neg Hx   . Pseudochol deficiency Neg Hx    Social History:  reports that she has never smoked. She has never used smokeless tobacco. She reports that she drinks alcohol. She reports that she does not use illicit drugs.  Allergies: No Known  Allergies  Medications Prior to Admission  Medication Sig Dispense Refill  . acetaminophen (TYLENOL) 500 MG tablet Take 500 mg by mouth every 6 (six) hours as needed. For pain      . alendronate (FOSAMAX) 35 MG tablet Take 35 mg by mouth every 7 (seven) days. Takes on Sundays/Mondays      . clobetasol cream (TEMOVATE) 2.53 % Apply 1 application topically 2 (two) times daily as needed. For rash      . FIBER SELECT GUMMIES CHEW Chew 1 tablet by mouth daily.      . flintstones complete (FLINTSTONES) 60 MG chewable tablet Chew 2 tablets by mouth daily.      Marland Kitchen lisinopril (PRINIVIL,ZESTRIL) 5 MG tablet Take 5 mg by mouth at bedtime.       . methotrexate (RHEUMATREX) 2.5 MG tablet Take 4 tablets by mouth once a week.      . naproxen (NAPROSYN) 250 MG tablet Take 250 mg by mouth 2 (two) times daily with a meal.       . triamcinolone ointment (KENALOG) 0.1 % Apply 1 application topically 2 (two) times daily as needed. For rash        ROS: Unavailable.  Physical Examination: Blood pressure 140/54, pulse 94, temperature 98.5 F (36.9 C), temperature source Oral, resp. rate 23, height 5' 5" (1.651 m), weight 83.008 kg (183 lb), SpO2 97.00%.  HEENT-  Normocephalic, no lesions,  without obvious abnormality.  Normal external eye and conjunctiva.  Normal TM's bilaterally.  Normal auditory canals and external ears. Normal external nose, mucus membranes and septum.  Normal pharynx. Neck supple with no masses, nodes, nodules or enlargement. Cardiovascular - regular rate and rhythm, S1, S2 normal, no murmur, click, rub or gallop Lungs - chest clear, no wheezing, rales, normal symmetric air entry, Heart exam - S1, S2 normal, no murmur, no gallop, rate regular Abdomen - soft, non-tender; bowel sounds normal; no masses,  no organomegaly Extremities - no joint deformities, effusion, or inflammation, no edema and no skin discoloration  Neurologic Examination: Mental Status: Alert, mute and in no acute distress.  Able to follow commands with only minimal difficulty understanding commands at times. Cranial Nerves: II-Visual fields were normal. III/IV/VI-Pupils were equal and reacted. Extraocular movements were full and conjugate.    V/VII-no facial numbness; slight right lower facial weakness facial weakness. VIII-normal. X-patient was unable to speak, including no formation. XII-midline tongue extension Motor: 5/5 bilaterally with normal tone and bulk; resting tremor of right hand was noted. Sensory: Normal throughout. Deep Tendon Reflexes: 1+ and symmetric. Plantars: Flexor bilaterally Cerebellar: Normal finger-to-nose testing. Carotid auscultation: Normal  Results for orders placed during the hospital encounter of 06/13/13 (from the past 48 hour(s))  COMPREHENSIVE METABOLIC PANEL     Status: Abnormal   Collection Time    06/13/13  2:13 PM      Result Value Range   Sodium 141  137 - 147 mEq/L   Potassium 3.9  3.7 - 5.3 mEq/L   Chloride 104  96 - 112 mEq/L   CO2 25  19 - 32 mEq/L   Glucose, Bld 95  70 - 99 mg/dL   BUN 16  6 - 23 mg/dL   Creatinine, Ser 0.91  0.50 - 1.10 mg/dL   Calcium 9.0  8.4 - 10.5 mg/dL   Total Protein 7.2  6.0 - 8.3 g/dL   Albumin 3.9  3.5 - 5.2 g/dL   AST 20  0 - 37 U/L   ALT 15  0 - 35 U/L   Alkaline Phosphatase 41  39 - 117 U/L   Total Bilirubin 0.6  0.3 - 1.2 mg/dL   GFR calc non Af Amer 58 (*) >90 mL/min   GFR calc Af Amer 67 (*) >90 mL/min   Comment: (NOTE)     The eGFR has been calculated using the CKD EPI equation.     This calculation has not been validated in all clinical situations.     eGFR's persistently <90 mL/min signify possible Chronic Kidney     Disease.  CBC WITH DIFFERENTIAL     Status: Abnormal   Collection Time    06/13/13  2:13 PM      Result Value Range   WBC 7.7  4.0 - 10.5 K/uL   RBC 3.66 (*) 3.87 - 5.11 MIL/uL   Hemoglobin 12.3  12.0 - 15.0 g/dL   HCT 37.2  36.0 - 46.0 %   MCV 101.6 (*) 78.0 - 100.0 fL   MCH 33.6  26.0 - 34.0  pg   MCHC 33.1  30.0 - 36.0 g/dL   RDW 15.3  11.5 - 15.5 %   Platelets 199  150 - 400 K/uL   Neutrophils Relative % 81 (*) 43 - 77 %   Neutro Abs 6.2  1.7 - 7.7 K/uL   Lymphocytes Relative 9 (*) 12 - 46 %   Lymphs Abs 0.7  0.7 -  4.0 K/uL   Monocytes Relative 9  3 - 12 %   Monocytes Absolute 0.7  0.1 - 1.0 K/uL   Eosinophils Relative 1  0 - 5 %   Eosinophils Absolute 0.1  0.0 - 0.7 K/uL   Basophils Relative 0  0 - 1 %   Basophils Absolute 0.0  0.0 - 0.1 K/uL  APTT     Status: None   Collection Time    06/13/13  3:24 PM      Result Value Range   aPTT 31  24 - 37 seconds  PROTIME-INR     Status: None   Collection Time    06/13/13  3:24 PM      Result Value Range   Prothrombin Time 12.6  11.6 - 15.2 seconds   INR 0.96  0.00 - 1.49  TYPE AND SCREEN     Status: None   Collection Time    06/13/13  3:25 PM      Result Value Range   ABO/RH(D) O NEG     Antibody Screen POS     Sample Expiration 06/16/2013     Ct Head Wo Contrast  06/13/2013   CLINICAL DATA:  Expressive aphasia.  EXAM: CT HEAD WITHOUT CONTRAST  TECHNIQUE: Contiguous axial images were obtained from the base of the skull through the vertex without intravenous contrast.  COMPARISON:  None.  FINDINGS: There is a left frontal lobe hemorrhage adjacent to the falx measuring 45 mm x 19 mm. Mass effect is mild with surrounding edema. Edema may be cytotoxic or vasogenic. There is edema extending into the corpus callosum. There is underlying atrophy and chronic ischemic white matter disease. Small amount of hemorrhage breaks through into the lateral ventricles with blood layering in both lateral ventricles (image 17 series 2). Posterior fossa structures appear within normal limits. Intracranial atherosclerosis is mild. There may also be a tiny amount of breakthrough subdural blood along the anterior falx (image number 24).  There is no hydrocephalus. No other mass lesions are identified. Paranasal sinuses appear within normal limits and  calvarium is intact.  IMPRESSION: 1. Left frontal parenchymal hemorrhage adjacent to the falx. This is favored to represent hemorrhagic infarction however an underlying neoplasm is difficult to exclude based on the edema and mass effect extending into the genu of the corpus callosum. 2. Critical Value/emergent results were called by telephone at the time of interpretation on 06/13/2013 at 3:03 PM to Dr. Christy Gentles , who verbally acknowledged these results.   Electronically Signed   By: Dereck Ligas M.D.   On: 06/13/2013 15:04    Assessment: 80 y.o. female presenting with acute left frontal intraparenchymal hemorrhage with ventricular extension, with associated marked expressive aphasia.  Stroke Risk Factors - hypertension  Plan: 1. HgbA1c, fasting lipid panel 2. MRI, MRA  of the brain without contrast 3. PT consult, OT consult, Speech consult 4. Echocardiogram 5. Carotid dopplers 6. Prophylactic therapy-None 7. Risk factor modification 8. Telemetry monitoring   C.R. Nicole Kindred, MD [facility] [phone]  06/13/2013, 6:48 PM

## 2013-06-13 NOTE — ED Notes (Signed)
Pt will follow command to squeeze hands, pt will not move feet on command. Per son pt drives, walks and talks at home. Noticed pt being non-verbal yesterday

## 2013-06-13 NOTE — ED Notes (Signed)
Per son patient is nonverbal and requires assistance to walk. Patient has tremors. Patient able to answer yes and no questions by nodding head. Patient does shake her head yes when asked if she is feeling weak.

## 2013-06-14 ENCOUNTER — Inpatient Hospital Stay (HOSPITAL_COMMUNITY): Payer: Medicare HMO

## 2013-06-14 ENCOUNTER — Encounter (HOSPITAL_COMMUNITY): Payer: Self-pay | Admitting: *Deleted

## 2013-06-14 DIAGNOSIS — I059 Rheumatic mitral valve disease, unspecified: Secondary | ICD-10-CM

## 2013-06-14 LAB — LIPID PANEL
CHOL/HDL RATIO: 2.3 ratio
Cholesterol: 193 mg/dL (ref 0–200)
HDL: 83 mg/dL (ref >39)
LDL Cholesterol: 93 mg/dL (ref 0–99)
TRIGLYCERIDES: 87 mg/dL (ref <150)
VLDL: 17 mg/dL (ref 0–40)

## 2013-06-14 LAB — HEMOGLOBIN A1C
Hgb A1c MFr Bld: 5.7 % — ABNORMAL HIGH (ref <5.7)
Mean Plasma Glucose: 117 mg/dL — ABNORMAL HIGH (ref <117)

## 2013-06-14 MED ORDER — PANTOPRAZOLE SODIUM 40 MG PO TBEC
40.0000 mg | DELAYED_RELEASE_TABLET | Freq: Every day | ORAL | Status: DC
  Administered 2013-06-14 – 2013-06-16 (×3): 40 mg via ORAL
  Filled 2013-06-14 (×3): qty 1

## 2013-06-14 MED ORDER — GADOBENATE DIMEGLUMINE 529 MG/ML IV SOLN
17.0000 mL | Freq: Once | INTRAVENOUS | Status: AC
  Administered 2013-06-14: 17 mL via INTRAVENOUS

## 2013-06-14 NOTE — Evaluation (Addendum)
Speech Language Pathology Evaluation Patient Details Name: [name] MRN: [id number] DOB: Jan 25, 1930 Today's Date: 06/14/2013 Time: 3154-0086 SLP Time Calculation (min): 31 min  Problem List:  Patient Active Problem List   Diagnosis Date Noted  . Hemorrhagic stroke 06/13/2013  . ICH (intracerebral hemorrhage) 06/13/2013  . Unspecified essential hypertension 06/13/2013  . Cervical arthritis 10/15/2012  . Arthritis/arthropathy of multiple joints 10/15/2012  . Muscle weakness (generalized) 09/12/2012  . Pain in joint, shoulder region 09/10/2012  . Rotator cuff syndrome 09/10/2012   Past Medical History:  Past Medical History  Diagnosis Date  . Hypertension   . Arthritis   . Cancer     melanoma of nose   Past Surgical History:  Past Surgical History  Procedure Laterality Date  . Abdominal hysterectomy      1982  . Cesarean section      x4  . Tonsillectomy      age 59  . Appendectomy      with 1 c-section  . Cataract extraction w/phaco  07/31/2011    Procedure: CATARACT EXTRACTION PHACO AND INTRAOCULAR LENS PLACEMENT (IOC);  Surgeon: Tonny Branch, MD;  Location: [facility];  Service: Ophthalmology;  Laterality: Right;  CDE: 11.31  . Cataract extraction w/phaco  08/14/2011    Procedure: CATARACT EXTRACTION PHACO AND INTRAOCULAR LENS PLACEMENT (IOC);  Surgeon: Tonny Branch, MD;  Location: [facility];  Service: Ophthalmology;  Laterality: Left;  CDE: 10.25   HPI:  80 y.o. female a history of hypertension and arthritis who presented to the emergency room at [facility] with inability to speak. Onset is unclear. Patient apparently has been symptomatic for past 2 days with speech difficulty and apparent confusion. Has no previous history of stroke nor TIA. She has not been on antiplatelet therapy. CT scan of the head showed left parenchymal hemorrhage adjacent to the falx. There was minimal extension of hemorrhage into left and right lateral ventricles. This was resumed hemorrhagic  infarction. However, mass lesion cannot be ruled out. Her NIH stroke score was 8. Patient was transferred to [facility] for further management.   Assessment / Plan / Recommendation Clinical Impression  Pt demonstrates significant expressive aphasia with receptive difficulties as well, also demonstrating decreased sustained attention. Pt able to answer biographical yes/ no questions with about 75% accuracy by nodding head "yes" or laughing for "no" response (when asked questions such as "is your name Manuela Schwartz"). Followed 1-step commands with about 50% accuracy with cues. Some automatic speech was generated with max cues- counting, saying first and last name. Providing extended time to process and utilizing visual cues were helpful. Difficult to assess cognition in-depth given decreased attention at this time. Communication board was too overwhelming. Rx that people communicating with pt allow extra processing time and use simple, shortened sentences. Yes/ no questions are easiest for pt to answer at this time. Pt has difficulties expressing basic wants and needs, putting her safety at risk. Speech will continue to follow for language tx and identifying multiple modes of communication that give pt success.    SLP Assessment  Patient needs continued Speech Lanaguage Pathology Services    Follow Up Recommendations  Inpatient Rehab    Frequency and Duration min 2x/week  2 weeks   Pertinent Vitals/Pain n/a   SLP Goals  SLP Goals Potential to Achieve Goals: Fair Potential Considerations: Severity of impairments SLP Goal #1: Pt will complete automatic speech tasks (name, counting, days of week) with max assist. SLP Goal #2: Pt will correctly point to  a named item in a field of 2 with max assist. SLP Goal #3: Pt will answer basic yes/ no questions with mod assist. SLP Goal #4: Pt will follow basic functional 1-step directions with max assist.  SLP Evaluation Prior Functioning  Cognitive/Linguistic  Baseline: Within functional limits   Cognition  Overall Cognitive Status: Difficult to assess Arousal/Alertness: Lethargic Orientation Level: Oriented to person Attention: Focused;Sustained;Selective Focused Attention: Appears intact Sustained Attention: Impaired Sustained Attention Impairment: Verbal basic;Functional basic Selective Attention: Impaired Selective Attention Impairment: Verbal basic;Functional basic Memory:  (unable to assess) Awareness: Impaired    Comprehension  Auditory Comprehension Overall Auditory Comprehension: Impaired Yes/No Questions: Impaired Basic Biographical Questions: 51-75% accurate Commands: Impaired One Step Basic Commands: 50-74% accurate    Expression Verbal Expression Overall Verbal Expression: Impaired Initiation: Impaired Automatic Speech: Name;Counting (with max cues) Level of Generative/Spontaneous Verbalization: Word Repetition: Impaired Level of Impairment: Word level Naming: Impairment Responsive: 0-25% accurate Confrontation: Impaired Verbal Errors: Other (comment) (very slow to initiate) Interfering Components: Attention Effective Techniques: Phonemic cues;Articulatory cues;Melodic intonation;Other (Comment) (visual cue to repeat words- look at therapist's mouth) Non-Verbal Means of Communication: Other (comment) (attempted comm board- too overwhelming) Written Expression Dominant Hand: Right Written Expression: Not tested   Oral / Motor Oral Motor/Sensory Function Overall Oral Motor/Sensory Function: Appears within functional limits for tasks assessed Labial ROM: Within Functional Limits Labial Symmetry: Within Functional Limits Labial Strength: Other (Comment) Lingual ROM: Within Functional Limits Lingual Symmetry: Within Functional Limits Lingual Strength: Within Functional Limits Motor Speech Overall Motor Speech: Appears within functional limits for tasks assessed Respiration: Within functional limits Phonation:  Other (comment) (difficult to initiate- sometimes just mouthing words) Resonance: Within functional limits Articulation: Within functional limitis Intelligibility: Intelligible   GO     Kern Reap, MA, CCC-SLP 06/14/2013, 4:31 PM

## 2013-06-14 NOTE — Progress Notes (Signed)
Patient still experiencing urinary retention. Bladder scan of 803 cc. Dr. Leonel Ramsay notified. Order for foley catheter given. Thayer Ohm D

## 2013-06-14 NOTE — Progress Notes (Signed)
Patient grimaced when bladder region was palpated. Bladder scan was 728 cc. Dr. Leonel Ramsay notified. Order given to In and Out Cath. Thayer Ohm D

## 2013-06-14 NOTE — Evaluation (Signed)
Clinical/Bedside Swallow Evaluation Patient Details  Name: [name] MRN: [id number] Date of Birth: April 26, 1930  Today's Date: 06/14/2013 Time: 1202-1218 SLP Time Calculation (min): 16 min  Past Medical History:  Past Medical History  Diagnosis Date  . Hypertension   . Arthritis   . Cancer     melanoma of nose   Past Surgical History:  Past Surgical History  Procedure Laterality Date  . Abdominal hysterectomy      1982  . Cesarean section      x4  . Tonsillectomy      age 54  . Appendectomy      with 1 c-section  . Cataract extraction w/phaco  07/31/2011    Procedure: CATARACT EXTRACTION PHACO AND INTRAOCULAR LENS PLACEMENT (IOC);  Surgeon: Tonny Branch, MD;  Location: [facility];  Service: Ophthalmology;  Laterality: Right;  CDE: 11.31  . Cataract extraction w/phaco  08/14/2011    Procedure: CATARACT EXTRACTION PHACO AND INTRAOCULAR LENS PLACEMENT (IOC);  Surgeon: Tonny Branch, MD;  Location: [facility];  Service: Ophthalmology;  Laterality: Left;  CDE: 10.25   HPI:  80 y.o. female a history of hypertension and arthritis who presented to the emergency room at [facility] with inability to speak. Onset is unclear. Patient apparently has been symptomatic for past 2 days with speech difficulty and apparent confusion. Has no previous history of stroke nor TIA. She has not been on antiplatelet therapy. CT scan of the head showed left parenchymal hemorrhage adjacent to the falx. There was minimal extension of hemorrhage into left and right lateral ventricles. This was resumed hemorrhagic infarction. However, mass lesion cannot be ruled out. Her NIH stroke score was 8. Patient was transferred to [facility] for further management.   Assessment / Plan / Recommendation Clinical Impression  Pt demonstrated no overt s/s of aspiration at bedside; swallow was consistently timely. No hx of swallow difficulties or pneumonia reported from ED notes. Given these findings, there appears to be no risk of  aspiration at this time. Rx initiating regular diet/ thin liquids. Speech will continue to follow for full speech/language eval/ tx.    Aspiration Risk  None    Diet Recommendation Regular;Thin liquid   Liquid Administration via: Cup;Straw Medication Administration: Whole meds with liquid Supervision: Patient able to self feed Postural Changes and/or Swallow Maneuvers: Seated upright 90 degrees    Other  Recommendations Oral Care Recommendations: Oral care BID   Follow Up Recommendations       Frequency and Duration        Pertinent Vitals/Pain n/a    SLP Swallow Goals     Swallow Study Prior Functional Status       General HPI: 80 y.o. female a history of hypertension and arthritis who presented to the emergency room at [facility] with inability to speak. Onset is unclear. Patient apparently has been symptomatic for past 2 days with speech difficulty and apparent confusion. Has no previous history of stroke nor TIA. She has not been on antiplatelet therapy. CT scan of the head showed left parenchymal hemorrhage adjacent to the falx. There was minimal extension of hemorrhage into left and right lateral ventricles. This was resumed hemorrhagic infarction. However, mass lesion cannot be ruled out. Her NIH stroke score was 8. Patient was transferred to [facility] for further management. Type of Study: Bedside swallow evaluation Diet Prior to this Study: NPO Temperature Spikes Noted: No Respiratory Status: Room air Behavior/Cognition: Alert;Cooperative;Distractible;Requires cueing Oral Cavity - Dentition: Adequate natural dentition Self-Feeding Abilities:  Able to feed self Patient Positioning: Upright in bed Baseline Vocal Quality: Clear Volitional Cough: Cognitively unable to elicit Volitional Swallow: Unable to elicit    Oral/Motor/Sensory Function Overall Oral Motor/Sensory Function: Appears within functional limits for tasks assessed Labial ROM: Within Functional  Limits Labial Symmetry: Within Functional Limits Labial Strength: Other (Comment) (unable to test but no signs of weakness while during POs) Lingual ROM: Within Functional Limits Lingual Symmetry: Within Functional Limits Lingual Strength: Within Functional Limits   Ice Chips Ice chips: Not tested   Thin Liquid Thin Liquid: Within functional limits Presentation: Cup;Straw    Nectar Thick Nectar Thick Liquid: Not tested   Honey Thick Honey Thick Liquid: Not tested   Puree Puree: Within functional limits Presentation: Self Fed;Spoon   Solid   GO    Solid: Within functional limits Presentation: Burke, Amy K, MA, CCC-SLP 06/14/2013,12:24 PM

## 2013-06-14 NOTE — Evaluation (Signed)
Physical Therapy Evaluation Patient Details Name: [name] MRN: [id number] DOB: Apr 20, 1930 Today's Date: 06/14/2013 Time: 9702-6378 PT Time Calculation (min): 24 min  PT Assessment / Plan / Recommendation History of Present Illness  Pt presenting with symptoms, generalized weakness and decreased verbalization,  x 2 days PTA.  Pt admitted with dx of ICH, Broca's aphasia.  Clinical Impression  Pt admitted with ICH. Pt currently with functional limitations due to the deficits listed below (see PT Problem List). Pt fatigued quickly sitting EOB requiring return to supine in bed after 10 minutes. Pt will benefit from skilled PT to increase their independence and safety with mobility to allow discharge to the venue listed below. Recommend SNF at d/c for continued PT services.  May need long term placement, pending progress.      PT Assessment  Patient needs continued PT services    Follow Up Recommendations  SNF;Supervision/Assistance - 24 hour    Does the patient have the potential to tolerate intense rehabilitation      Barriers to Discharge        Equipment Recommendations  None recommended by PT    Recommendations for Other Services OT consult   Frequency Min 3X/week    Precautions / Restrictions Restrictions Weight Bearing Restrictions: No   Pertinent Vitals/Pain 0/10      Mobility  Bed Mobility Overal bed mobility: Needs Assistance Bed Mobility: Supine to Sit;Sit to Supine Supine to sit: Max assist;HOB elevated Sit to supine: Mod assist General bed mobility comments: verbal/tactile cues for sequencing Transfers Overall transfer level: Needs assistance General transfer comment: attempted sit to stand with RW bedside, but unsuccessful.  Pt unable to stand with +1 Max assist. Modified Rankin (Stroke Patients Only) Pre-Morbid Rankin Score: No symptoms Modified Rankin: Moderately severe disability    Exercises     PT Diagnosis: Difficulty walking;Generalized  weakness;Altered mental status  PT Problem List: Decreased activity tolerance;Decreased balance;Decreased mobility;Decreased coordination;Decreased cognition;Decreased safety awareness PT Treatment Interventions: DME instruction;Gait training;Functional mobility training;Therapeutic activities;Therapeutic exercise;Balance training;Neuromuscular re-education;Cognitive remediation;Patient/family education     PT Goals(Current goals can be found in the care plan section) Acute Rehab PT Goals Patient Stated Goal: unable to state PT Goal Formulation: Patient unable to participate in goal setting Time For Goal Achievement: 06/28/13 Potential to Achieve Goals: Fair  Visit Information  Last PT Received On: 06/14/13 Assistance Needed: +2 History of Present Illness: Pt presenting with symptoms, generalized weakness and decreased verbalization,  x 2 days PTA.  Pt admitted with dx of ICH, Broca's aphasia.       Prior [place] expects to be discharged to:: Skilled nursing facility Living Arrangements: Other relatives Prior Function Level of Independence: Independent Communication Communication: No difficulties Dominant Hand: Right    Cognition  Cognition Arousal/Alertness: Awake/alert Behavior During Therapy: Flat affect Overall Cognitive Status: Difficult to assess Difficult to assess due to: Impaired communication    Extremity/Trunk Assessment     Balance Balance Overall balance assessment: Needs assistance Sitting-balance support: Feet supported;Bilateral upper extremity supported Sitting balance-[name]: Fair  End of Session PT - End of Session Equipment Utilized During Treatment: Gait belt Activity Tolerance: Patient limited by fatigue Patient left: in bed;with call bell/phone within reach;with nursing/sitter in room Nurse Communication: Mobility status  GP     [name] 06/14/2013, 2:15 PM  [name], PT  Office #  [phone] Pager [phone]

## 2013-06-14 NOTE — Progress Notes (Signed)
Echo Lab  2D Echocardiogram completed.  [place], RDCS 06/14/2013 10:29 AM

## 2013-06-14 NOTE — Progress Notes (Signed)
Bilateral carotid artery duplex:  1-39% ICA stenosis.  Vertebral artery flow is antegrade.     

## 2013-06-14 NOTE — Progress Notes (Addendum)
Stroke Team Progress Note  HISTORY 80 y.o. female a history of hypertension and arthritis who presented to the emergency room at [facility] with inability to speak. Onset is unclear. Patient apparently has been symptomatic for past 2 days with speech difficulty and apparent confusion. Has no previous history of stroke nor TIA. She has not been on antiplatelet therapy. CT scan of the head showed left parenchymal hemorrhage adjacent to the falx. There was minimal extension of hemorrhage into left and right lateral ventricles. This was resumed hemorrhagic infarction. However, mass lesion cannot be ruled out. Her NIH stroke score was 8. Patient was transferred to [facility] for further management.     SUBJECTIVE Mental status much improved overnight. Able to follow commands. Weakness in LE improved as well.   OBJECTIVE Most recent Vital Signs: Filed Vitals:   06/14/13 0400 06/14/13 0500 06/14/13 0600 06/14/13 0700  BP: 140/54 136/49 138/53 138/56  Pulse: 66 71 66 65  Temp:      TempSrc:      Resp: 18 16 17 17   Height:      Weight:      SpO2: 97% 97% 96% 97%   CBG (last 3)  No results found for this basename: GLUCAP,  in the last 72 hours  IV Fluid Intake:   . sodium chloride 75 mL/hr at 06/14/13 0700    MEDICATIONS  . ondansetron  4 mg Intramuscular Once  . pantoprazole (PROTONIX) IV  40 mg Intravenous QHS  . senna-docusate  1 tablet Oral BID   PRN:  acetaminophen, acetaminophen, labetalol, traMADol  Diet:  NPO Activity:  Bedrest DVT Prophylaxis:  SCDs  CLINICALLY SIGNIFICANT STUDIES Basic Metabolic Panel:  Recent Labs Lab 06/13/13 1413  NA 141  K 3.9  CL 104  CO2 25  GLUCOSE 95  BUN 16  CREATININE 0.91  CALCIUM 9.0   Liver Function Tests:  Recent Labs Lab 06/13/13 1413  AST 20  ALT 15  ALKPHOS 41  BILITOT 0.6  PROT 7.2  ALBUMIN 3.9   CBC:  Recent Labs Lab 06/13/13 1413  WBC 7.7  NEUTROABS 6.2  HGB 12.3  HCT 37.2  MCV 101.6*  PLT 199    Coagulation:  Recent Labs Lab 06/13/13 1524  LABPROT 12.6  INR 0.96   Cardiac Enzymes: No results found for this basename: CKTOTAL, CKMB, CKMBINDEX, TROPONINI,  in the last 168 hours Urinalysis:  Recent Labs Lab 06/13/13 2114  COLORURINE YELLOW  LABSPEC 1.017  PHURINE 7.5  GLUCOSEU NEGATIVE  HGBUR NEGATIVE  BILIRUBINUR NEGATIVE  KETONESUR 40*  PROTEINUR NEGATIVE  UROBILINOGEN 0.2  NITRITE NEGATIVE  LEUKOCYTESUR NEGATIVE   Lipid Panel No results found for this basename: chol, trig, hdl, cholhdl, vldl, ldlcalc   HgbA1C  No results found for this basename: HGBA1C    Urine Drug Screen:   No results found for this basename: labopia, cocainscrnur, labbenz, amphetmu, thcu, labbarb    Alcohol Level: No results found for this basename: ETH,  in the last 168 hours  Ct Head Wo Contrast  06/13/2013   CLINICAL DATA:  Expressive aphasia.  EXAM: CT HEAD WITHOUT CONTRAST  TECHNIQUE: Contiguous axial images were obtained from the base of the skull through the vertex without intravenous contrast.  COMPARISON:  None.  FINDINGS: There is a left frontal lobe hemorrhage adjacent to the falx measuring 45 mm x 19 mm. Mass effect is mild with surrounding edema. Edema may be cytotoxic or vasogenic. There is edema extending into the corpus callosum. There  is underlying atrophy and chronic ischemic white matter disease. Small amount of hemorrhage breaks through into the lateral ventricles with blood layering in both lateral ventricles (image 17 series 2). Posterior fossa structures appear within normal limits. Intracranial atherosclerosis is mild. There may also be a tiny amount of breakthrough subdural blood along the anterior falx (image number 24).  There is no hydrocephalus. No other mass lesions are identified. Paranasal sinuses appear within normal limits and calvarium is intact.  IMPRESSION: 1. Left frontal parenchymal hemorrhage adjacent to the falx. This is favored to represent hemorrhagic  infarction however an underlying neoplasm is difficult to exclude based on the edema and mass effect extending into the genu of the corpus callosum. 2. Critical Value/emergent results were called by telephone at the time of interpretation on 06/13/2013 at 3:03 PM to Dr. Christy [name] , who verbally acknowledged these results.   Electronically Signed   By: [name] Ligas M.D.   On: 06/13/2013 15:04    CT of the brain  Left frontal parenchymal hemorrhage adjacent to the falx. This is  favored to represent hemorrhagic infarction however an underlying  neoplasm is difficult to exclude based on the edema and mass effect  extending into the genu of the corpus callosum.   MRI of the brain    MRA of the brain    2D Echocardiogram    Carotid Doppler    CXR    EKG sinus  Therapy Recommendations pending  Physical Exam   HEENT- Normocephalic, no lesions, without obvious abnormality. Normal external eye and conjunctiva. Normal TM's bilaterally. Normal auditory canals and external ears. Normal external nose, mucus membranes and septum. Normal pharynx.  Neck supple with no masses, nodes, nodules or enlargement.  Cardiovascular - regular rate and rhythm, S1, S2 normal, no murmur, click, rub or gallop  Lungs - chest clear, no wheezing, rales, normal symmetric air entry, Heart exam - S1, S2 normal, no murmur, no gallop, rate regular  Abdomen - soft, non-tender; bowel sounds normal; no masses, no organomegaly  Extremities - no joint deformities, effusion, or inflammation, no edema and no skin discoloration  Neurologic Examination:  Mental Status:  Alert, able to state few words at a time. Speech not fluent.  Cranial Nerves:  II-Visual fields were normal.  III/IV/VI-Pupils were equal and reacted. Extraocular movements were full and conjugate.  V/VII-no facial numbness; slight right lower facial weakness facial weakness.  VIII-normal.  X-patient was unable to speak, including no formation.  XII-midline  tongue extension  Motor: 4+/5 b/l UE, 4/5 b/l LE Sensory: Normal throughout.  Deep Tendon Reflexes: 1+ and symmetric.  Plantars: Flexor bilaterally  Cerebellar: Normal finger-to-nose testing.  Carotid auscultation: Normal   ASSESSMENT [name] is a 80 y.o. female  presenting with acute perifalxine hemorrhage with minimal ventricular extension, with associated marked expressive aphasia which has has significantly improved. Motor exam improved as well.     Hospital day # 1  TREATMENT/PLAN  con't hold anti platelet medications  MRI B with and w/ out along with MRA   Carotid dopplers  2decho  Hba1c, lipid panel  Pt/ot speech  Sbp<160  Suspect can be transferred out of the unit tomorrow AM.   Leotis Pain  06/14/2013 8:14 AM  I have personally obtained a history, examined the patient, evaluated imaging results, and formulated the assessment and plan of care. I agree with the above.

## 2013-06-15 MED ORDER — BIOTENE DRY MOUTH MT LIQD
15.0000 mL | Freq: Two times a day (BID) | OROMUCOSAL | Status: DC
  Administered 2013-06-16 – 2013-06-19 (×7): 15 mL via OROMUCOSAL

## 2013-06-15 NOTE — Progress Notes (Addendum)
Stroke Team Progress Note  HISTORY 80 y.o. female a history of hypertension and arthritis who presented to the emergency room at [facility] with inability to speak. Onset is unclear. Patient apparently has been symptomatic for past 2 days with speech difficulty and apparent confusion. Has no previous history of stroke nor TIA. She has not been on antiplatelet therapy. CT scan of the head showed left parenchymal hemorrhage adjacent to the falx. There was minimal extension of hemorrhage into left and right lateral ventricles. This was resumed hemorrhagic infarction. However, mass lesion cannot be ruled out. Her NIH stroke score was 8. Patient was transferred to [facility] for further management.     SUBJECTIVE Mental status much improved overnight. Able to follow commands. Weakness in LE improved as well.   OBJECTIVE Most recent Vital Signs: Filed Vitals:   06/15/13 0400 06/15/13 0500 06/15/13 0600 06/15/13 0700  BP: 140/52 138/48 137/55 125/54  Pulse: 66 66 53 70  Temp: 99.1 F (37.3 C)     TempSrc: Oral     Resp: 15 16 19 19   Height:      Weight:      SpO2: 99% 98% 96% 97%   CBG (last 3)  No results found for this basename: GLUCAP,  in the last 72 hours  IV Fluid Intake:   . sodium chloride 75 mL/hr at 06/15/13 0700    MEDICATIONS  . ondansetron  4 mg Intramuscular Once  . pantoprazole  40 mg Oral QHS  . senna-docusate  1 tablet Oral BID   PRN:  acetaminophen, acetaminophen, labetalol, traMADol  Diet:  General Activity:  Bedrest DVT Prophylaxis:  SCDs  CLINICALLY SIGNIFICANT STUDIES Basic Metabolic Panel:   Recent Labs Lab 06/13/13 1413  NA 141  K 3.9  CL 104  CO2 25  GLUCOSE 95  BUN 16  CREATININE 0.91  CALCIUM 9.0   Liver Function Tests:   Recent Labs Lab 06/13/13 1413  AST 20  ALT 15  ALKPHOS 41  BILITOT 0.6  PROT 7.2  ALBUMIN 3.9   CBC:   Recent Labs Lab 06/13/13 1413  WBC 7.7  NEUTROABS 6.2  HGB 12.3  HCT 37.2  MCV 101.6*  PLT 199    Coagulation:   Recent Labs Lab 06/13/13 1524  LABPROT 12.6  INR 0.96   Cardiac Enzymes: No results found for this basename: CKTOTAL, CKMB, CKMBINDEX, TROPONINI,  in the last 168 hours Urinalysis:   Recent Labs Lab 06/13/13 2114  COLORURINE YELLOW  LABSPEC 1.017  PHURINE 7.5  GLUCOSEU NEGATIVE  HGBUR NEGATIVE  BILIRUBINUR NEGATIVE  KETONESUR 40*  PROTEINUR NEGATIVE  UROBILINOGEN 0.2  NITRITE NEGATIVE  LEUKOCYTESUR NEGATIVE   Lipid Panel    Component Value Date/Time   CHOL 193 06/14/2013 1040   HgbA1C  Lab Results  Component Value Date   HGBA1C 5.7* 06/14/2013    Urine Drug Screen:   No results found for this basename: labopia,  cocainscrnur,  labbenz,  amphetmu,  thcu,  labbarb    Alcohol Level: No results found for this basename: ETH,  in the last 168 hours  Ct Head Wo Contrast  06/13/2013   CLINICAL DATA:  Expressive aphasia.  EXAM: CT HEAD WITHOUT CONTRAST  TECHNIQUE: Contiguous axial images were obtained from the base of the skull through the vertex without intravenous contrast.  COMPARISON:  None.  FINDINGS: There is a left frontal lobe hemorrhage adjacent to the falx measuring 45 mm x 19 mm. Mass effect is mild with surrounding edema.  Edema may be cytotoxic or vasogenic. There is edema extending into the corpus callosum. There is underlying atrophy and chronic ischemic white matter disease. Small amount of hemorrhage breaks through into the lateral ventricles with blood layering in both lateral ventricles (image 17 series 2). Posterior fossa structures appear within normal limits. Intracranial atherosclerosis is mild. There may also be a tiny amount of breakthrough subdural blood along the anterior falx (image number 24).  There is no hydrocephalus. No other mass lesions are identified. Paranasal sinuses appear within normal limits and calvarium is intact.  IMPRESSION: 1. Left frontal parenchymal hemorrhage adjacent to the falx. This is favored to represent hemorrhagic  infarction however an underlying neoplasm is difficult to exclude based on the edema and mass effect extending into the genu of the corpus callosum. 2. Critical Value/emergent results were called by telephone at the time of interpretation on 06/13/2013 at 3:03 PM to Dr. Christy Gentles , who verbally acknowledged these results.   Electronically Signed   By: Dereck Ligas M.D.   On: 06/13/2013 15:04   Mr Jodene Nam Head Wo Contrast  06/14/2013   CLINICAL DATA:  Stroke.  Hypertension.  Speech disturbance.  EXAM: MRI HEAD WITHOUT CONTRAST  MRA HEAD WITHOUT CONTRAST  TECHNIQUE: Multiplanar, multiecho pulse sequences of the brain and surrounding structures were obtained without intravenous contrast. Angiographic images of the head were obtained using MRA technique without contrast.  COMPARISON:  Head CT 06/13/2013  FINDINGS: MRI HEAD FINDINGS  The brainstem and cerebellum are unremarkable. The cerebral hemispheres show a background pattern of atrophy and extensive chronic small vessel disease affecting the deep and subcortical white matter. There is hemorrhagic infarction in the left frontal lobe. Hematoma size is not larger than on the previous CT, estimated at approximately 4.7 x 1.7 cm. The hematoma communicates with the left frontal formed and there is blood layering dependently in the lateral ventricles. This has not increased in amount. There are numerous other foci of hemosiderin deposition within the brain along the surfaces of the brain consistent with previous hemorrhagic infarctions and episodes of subarachnoid hemorrhage. After contrast administration, there is low level contrast enhancement along the margins of the left frontal infarction. The enhancement pattern does not suggest an underlying mass.  Ventricles do not appear obstructed. There is no extra-axial collection.  MRA HEAD FINDINGS  Both internal carotid arteries are patent into the brain. The anterior and middle cerebral vessels are patent without proximal  stenosis, aneurysm or vascular malformation. Both vertebral arteries are patent to the basilar. No basilar stenosis. Posterior circulation branch vessels are patent with the right PCA taking a fetal origin from the anterior circulation.  IMPRESSION: Left frontal lobe infarction with an intraparenchymal hematoma measuring approximately 4.7 x 1.7 cm, not significantly changed since the previous CT. Surrounding edema. Intraventricular penetration with the amount of intraventricular blood being the same. No hydrocephalus.  Chronic ischemic changes elsewhere throughout the brain, many associated with hemosiderin deposition indicating previous hemorrhagic infarctions. There is also hemosiderin deposition along with surface of the brain indicating previous subarachnoid hemorrhage.  MRA does not show major vessel occlusion or correctable proximal stenosis. No evidence of aneurysm or high flow vascular malformation.   Electronically Signed   By: Nelson Chimes M.D.   On: 06/14/2013 19:20   Mr Jeri Cos [id number] Contrast  06/14/2013   CLINICAL DATA:  Stroke.  Hypertension.  Speech disturbance.  EXAM: MRI HEAD WITHOUT CONTRAST  MRA HEAD WITHOUT CONTRAST  TECHNIQUE: Multiplanar, multiecho pulse sequences of the brain  and surrounding structures were obtained without intravenous contrast. Angiographic images of the head were obtained using MRA technique without contrast.  COMPARISON:  Head CT 06/13/2013  FINDINGS: MRI HEAD FINDINGS  The brainstem and cerebellum are unremarkable. The cerebral hemispheres show a background pattern of atrophy and extensive chronic small vessel disease affecting the deep and subcortical white matter. There is hemorrhagic infarction in the left frontal lobe. Hematoma size is not larger than on the previous CT, estimated at approximately 4.7 x 1.7 cm. The hematoma communicates with the left frontal formed and there is blood layering dependently in the lateral ventricles. This has not increased in amount. There  are numerous other foci of hemosiderin deposition within the brain along the surfaces of the brain consistent with previous hemorrhagic infarctions and episodes of subarachnoid hemorrhage. After contrast administration, there is low level contrast enhancement along the margins of the left frontal infarction. The enhancement pattern does not suggest an underlying mass.  Ventricles do not appear obstructed. There is no extra-axial collection.  MRA HEAD FINDINGS  Both internal carotid arteries are patent into the brain. The anterior and middle cerebral vessels are patent without proximal stenosis, aneurysm or vascular malformation. Both vertebral arteries are patent to the basilar. No basilar stenosis. Posterior circulation branch vessels are patent with the right PCA taking a fetal origin from the anterior circulation.  IMPRESSION: Left frontal lobe infarction with an intraparenchymal hematoma measuring approximately 4.7 x 1.7 cm, not significantly changed since the previous CT. Surrounding edema. Intraventricular penetration with the amount of intraventricular blood being the same. No hydrocephalus.  Chronic ischemic changes elsewhere throughout the brain, many associated with hemosiderin deposition indicating previous hemorrhagic infarctions. There is also hemosiderin deposition along with surface of the brain indicating previous subarachnoid hemorrhage.  MRA does not show major vessel occlusion or correctable proximal stenosis. No evidence of aneurysm or high flow vascular malformation.   Electronically Signed   By: Nelson Chimes M.D.   On: 06/14/2013 19:20    CT of the brain  Left frontal parenchymal hemorrhage adjacent to the falx. This is  favored to represent hemorrhagic infarction however an underlying  neoplasm is difficult to exclude based on the edema and mass effect  extending into the genu of the corpus callosum.   MRI of the brain  Left frontal lobe infarction with an intraparenchymal hematoma   measuring approximately 4.7 x 1.7 cm, not significantly changed  since the previous CT. Surrounding edema. Intraventricular  penetration with the amount of intraventricular blood being the  same.   MRA of the brain  No major vessel stenosis.   2D Echocardiogram  The estimated ejection fraction was in the range of 55% to 60%. Wall motion was normal; there were no regional wall motion abnormalities.    Carotid Doppler    CXR    EKG sinus  Therapy Recommendations pending  Physical Exam   HEENT- Normocephalic, no lesions, without obvious abnormality. Normal external eye and conjunctiva. Normal TM's bilaterally. Normal auditory canals and external ears. Normal external nose, mucus membranes and septum. Normal pharynx.  Neck supple with no masses, nodes, nodules or enlargement.  Cardiovascular - regular rate and rhythm, S1, S2 normal, no murmur, click, rub or gallop  Lungs - chest clear, no wheezing, rales, normal symmetric air entry, Heart exam - S1, S2 normal, no murmur, no gallop, rate regular  Abdomen - soft, non-tender; bowel sounds normal; no masses, no organomegaly  Extremities - no joint deformities, effusion, or inflammation, no edema  and no skin discoloration  Neurologic Examination:  Mental Status:  Alert, able to state few words at a time. Speech not fluent.  Cranial Nerves:  II-Visual fields were normal.  III/IV/VI-Pupils were equal and reacted. Extraocular movements were full and conjugate.  V/VII-no facial numbness; slight right lower facial weakness facial weakness.  VIII-normal.  X-patient was unable to speak, including no formation.  XII-midline tongue extension  Motor: 4+/5 b/l UE, 4/5 b/l LE Sensory: Normal throughout.  Deep Tendon Reflexes: 1+ and symmetric.  Plantars: Flexor bilaterally  Cerebellar: Normal finger-to-nose testing.  Carotid auscultation: Normal   ASSESSMENT [name] is a 80 y.o. female  presenting with acute perifalxine  hemorrhage with minimal ventricular extension, with associated marked expressive aphasia which has has significantly improved. Motor exam improved as well.  - Improvement in speech today which is more fluent - MRI stroke, no underlying malignancy.     Hospital day # 2  TREATMENT/PLAN  con't hold anti platelet medications  con't pt/ot  Transfer to floor today  Spoke to family and updated medical condition.   BP well controlled.   S/p foley placement for increased retention overnight.    Leotis Pain  06/15/2013 8:58 AM  I have personally obtained a history, examined the patient, evaluated imaging results, and formulated the assessment and plan of care. I agree with the above.

## 2013-06-16 DIAGNOSIS — I619 Nontraumatic intracerebral hemorrhage, unspecified: Secondary | ICD-10-CM

## 2013-06-16 MED ORDER — LISINOPRIL 5 MG PO TABS
5.0000 mg | ORAL_TABLET | Freq: Every day | ORAL | Status: DC
  Administered 2013-06-16 – 2013-06-18 (×3): 5 mg via ORAL
  Filled 2013-06-16 (×4): qty 1

## 2013-06-16 NOTE — Progress Notes (Signed)
OT EVALUATION  Pt with significant cognitive deficits, including decreased initiation and initiative for activities, in addition to below deficits. Rec SNF at this time. Pt will benefit from skilled OT services to facilitate D/C to next venue due to below deficits.   06/16/13 1600  OT Visit Information  Last OT Received On 06/16/13  Assistance Needed +2  History of Present Illness Pt presenting with symptoms, generalized weakness and decreased verbalization,  x 2 days PTA.  Pt admitted with dx of ICH, Broca's aphasia.  Precautions  Precautions Fall  Home Living  Family/patient expects to be discharged to: Skilled nursing facility  Prior Function  Level of Independence Independent  Comments unsure  Communication  Communication Other (comment) (dealyed speech)  Cognition  Arousal/Alertness Awake/alert  Behavior During Therapy Flat affect  Overall Cognitive Status Impaired/Different from baseline  Area of Impairment Attention;Following commands;Safety/judgement;Awareness;Problem solving  Current Attention Level Sustained  Following Commands Follows one step commands inconsistently  Safety/Judgement Decreased awareness of safety;Decreased awareness of deficits  Awareness Intellectual  Problem Solving Slow processing;Decreased initiation;Difficulty sequencing;Requires verbal cues;Requires tactile cues  General Comments lack of interest in activities. Answering questions  Upper Extremity Assessment  Upper Extremity Assessment RUE deficits/detail;LUE deficits/detail  RUE Deficits / Details weakness greater in shoulder  RUE Coordination decreased gross motor  LUE Deficits / Details decreased proximal weakness  LUE Coordination decreased gross motor  Lower Extremity Assessment  Lower Extremity Assessment Defer to PT evaluation  Cervical / Trunk Assessment  Cervical / Trunk Assessment Other exceptions  Cervical / Trunk Exceptions R bias  ADL  Grooming Wash/dry hands;Wash/dry face   Where Assessed - Grooming Supported sitting  Upper Body Bathing Moderate assistance  Where Assessed - Upper Body Bathing Supported sitting  Lower Body Bathing Maximal assistance  Where Assessed - Lower Body Bathing Supported sit to stand  Upper Body Dressing Maximal assistance  Where Assessed - Upper Body Dressing Supported sitting  Lower Body Dressing Maximal assistance  Where Assessed - Lower Body Dressing Supported sit to Cytogeneticist Method Sit to stand;Stand pivot  Toileting - Clothing Manipulation and Hygiene +1 Total assistance (incontinent)  Equipment Used Gait belt  Transfers/Ambulation Related to ADLs Max A with incresaed time  ADL Comments affected by poor attention and decreased initiation  Vision - History  Baseline Vision Wears glasses all the time  Vision - Assessment  Eye Alignment [facility]  Perception  Perception WFL  Praxis  Praxis Impaired  Praxis Impairment Details Initiation  Bed Mobility  Overal bed mobility Needs Assistance  Bed Mobility Supine to Sit  Supine to sit Mod assist  General bed mobility comments verbal/tactile cues for sequencing  Transfers  Overall transfer level Needs assistance  Transfers Sit to/from Stand;Stand Pivot Transfers  Sit to Stand Max assist  Stand pivot transfers Max assist  General transfer comment increased time required  Balance  Overall balance assessment Needs assistance  Sitting-balance support Bilateral upper extremity supported;Feet supported  Sitting balance-[name] Poor  Sitting balance - Comments R bias  Postural control Right lateral lean  Standing balance support Bilateral upper extremity supported  Standing balance-[name] Poor  OT - End of Session  Equipment Utilized During Treatment Gait belt  Activity Tolerance Patient limited by lethargy;Other (comment) (cognitive deficits)  Patient left in chair;with call bell/phone within reach;with chair alarm set   Nurse Communication Mobility status  OT Assessment  OT Recommendation/Assessment Patient needs continued OT Services  OT Problem List Decreased strength;Decreased range  of motion;Decreased activity tolerance;Impaired balance (sitting and/or standing);Decreased coordination;Decreased cognition;Decreased safety awareness;Decreased knowledge of precautions  Barriers to Discharge Decreased caregiver support  Barriers to Discharge Comments 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  OT Therapy Diagnosis  Generalized weakness;Cognitive deficits;Apraxia  OT Plan  OT Frequency Min 2X/week  OT Treatment/Interventions Self-care/ADL training;Therapeutic exercise;Neuromuscular education;DME and/or AE instruction;Therapeutic activities;Cognitive remediation/compensation;Patient/family education  OT Recommendation  Follow Up Recommendations SNF;Supervision/Assistance - 24 hour  OT Equipment None recommended by OT  Individuals Consulted  Consulted and Agree with Results and Recommendations Patient unable/family or caregiver not available  Acute Rehab OT Goals  Patient Stated Goal unable to state  OT Goal Formulation Patient unable to participate in goal setting  Time For Goal Achievement 06/30/13  Potential to Achieve Goals Good  OT Time Calculation  OT Start Time 1440  OT Stop Time 1507  OT Time Calculation (min) 27 min  OT General Charges  $OT Visit 1 Procedure  OT Evaluation  $Initial OT Evaluation Tier I 1 Procedure  [facility], OTR/L  [phone] 06/16/2013

## 2013-06-16 NOTE — Clinical Social Work Note (Signed)
Clinical Social Work Department BRIEF PSYCHOSOCIAL ASSESSMENT 06/16/2013  Patient:  [name], [name]     Account Number:  [id number]     Admit date:  06/13/2013  Clinical Social Worker:  Myles Lipps  Date/Time:  06/16/2013 04:00 PM  Referred by:  RN  Date Referred:  06/16/2013 Referred for  SNF Placement   Other Referral:   Interview type:  Family Other interview type:   Spoke to patient son over the phone who provided background information and then requested that patient daughter, [name] be contacted regarding patient rehab needs and plans - left message with patient daughter.    PSYCHOSOCIAL DATA Living Status:  SIBLING Admitted from facility:   Level of care:   Primary support name:  [name] [phone] Primary support relationship to patient:  CHILD, ADULT Degree of support available:   Strong    CURRENT CONCERNS Current Concerns  Post-Acute Placement   Other Concerns:   Inpatient rehab vs. SNF    SOCIAL WORK ASSESSMENT / PLAN Clinical Social Worker spoke with patient son and daughter over the phone to offer support and discuss patient needs at discharge.  Patient son states that patient currently lives at home and is the primary caregiver to her sister who suffers from memory loss and polio as a child.  Patient sister is now being cared for by her son.    CSW spoke with patient daughter regarding patient potential rehab needs.  Patient daughter states that she would prefer placement at [facility] due to location and the potential need for long term care.  CSW to complete FL2 and initiate bed search to [facility] - will have to verify in network facilities for patient insurance.  Patient daughter aware that [facility] may not have bed availbility or be in network with the insurance and will speak with patient brother regarding a second option.  CSW remains available for support and to facilitate patient discharge needs once medically ready.   Assessment/plan  status:  Psychosocial Support/Ongoing Assessment of Needs Other assessment/ plan:   Information/referral to community resources:   No resources provided at this time due to no family presence at the bedside    PATIENT'S/FAMILY'S RESPONSE TO PLAN OF CARE: Patient alert and sitting up in the chair.  Patient unable to effectively communicate and therefore spoke with patient family over the phone.  Patient family is very supportive and willing to assist as much as possible.  Patient son lives locally in [place] and patient daughter lives out of state.  Patient daughter will be here to visit in about a week when she is cleared from her recent heart attack. Patient family is all in agreement with plans for SNF over inpatient rehab.  Patient family verbalized their appreciation for CSW support and concern.

## 2013-06-16 NOTE — Progress Notes (Signed)
UR completed.  Dallas Torok, RN BSN [facility] CCM Trauma/Neuro ICU Case Manager [phone]  

## 2013-06-16 NOTE — Progress Notes (Addendum)
Stroke Team Progress Note  HISTORY 80 y.o. female a history of hypertension and arthritis who presented to the emergency room at [facility] 06/13/2013 with inability to speak. Onset is unclear. Patient apparently has been symptomatic for past 2 days with speech difficulty and apparent confusion. Has no previous history of stroke nor TIA. She has not been on antiplatelet therapy. CT scan of the head showed left parenchymal hemorrhage adjacent to the falx. There was minimal extension of hemorrhage into left and right lateral ventricles. This was resumed hemorrhagic infarction. However, mass lesion cannot be ruled out. Her NIH stroke score was 8. Patient was transferred to [facility] for further management. She was not a tPA candidate due to delay in arrival.   SUBJECTIVE No family at beside. No floor bed available for transfer yesterday.  OBJECTIVE Most recent Vital Signs: Filed Vitals:   06/16/13 0400 06/16/13 0500 06/16/13 0600 06/16/13 0700  BP: 147/56 142/43 135/50 116/44  Pulse: 71 72 75 66  Temp:      TempSrc:      Resp: 20 21 19 19   Height:      Weight: 76.5 kg (168 lb 10.4 oz)     SpO2: 97% 97% 96% 96%   CBG (last 3)  No results found for this basename: GLUCAP,  in the last 72 hours  IV Fluid Intake:   . sodium chloride 75 mL/hr at 06/16/13 0700    MEDICATIONS  . antiseptic oral rinse  15 mL Mouth Rinse BID  . ondansetron  4 mg Intramuscular Once  . pantoprazole  40 mg Oral QHS  . senna-docusate  1 tablet Oral BID   PRN:  acetaminophen, acetaminophen, labetalol, traMADol  Diet:  General thin liquids Activity:  Up with assistance DVT Prophylaxis:  SCDs  CLINICALLY SIGNIFICANT STUDIES Basic Metabolic Panel:   Recent Labs Lab 06/13/13 1413  NA 141  K 3.9  CL 104  CO2 25  GLUCOSE 95  BUN 16  CREATININE 0.91  CALCIUM 9.0   Liver Function Tests:   Recent Labs Lab 06/13/13 1413  AST 20  ALT 15  ALKPHOS 41  BILITOT 0.6  PROT 7.2  ALBUMIN 3.9   CBC:    Recent Labs Lab 06/13/13 1413  WBC 7.7  NEUTROABS 6.2  HGB 12.3  HCT 37.2  MCV 101.6*  PLT 199   Coagulation:   Recent Labs Lab 06/13/13 1524  LABPROT 12.6  INR 0.96   Cardiac Enzymes: No results found for this basename: CKTOTAL, CKMB, CKMBINDEX, TROPONINI,  in the last 168 hours Urinalysis:   Recent Labs Lab 06/13/13 2114  COLORURINE YELLOW  LABSPEC 1.017  PHURINE 7.5  GLUCOSEU NEGATIVE  HGBUR NEGATIVE  BILIRUBINUR NEGATIVE  KETONESUR 40*  PROTEINUR NEGATIVE  UROBILINOGEN 0.2  NITRITE NEGATIVE  LEUKOCYTESUR NEGATIVE   Lipid Panel    Component Value Date/Time   CHOL 193 06/14/2013 1040   HgbA1C  Lab Results  Component Value Date   HGBA1C 5.7* 06/14/2013    Urine Drug Screen:   No results found for this basename: labopia,  cocainscrnur,  labbenz,  amphetmu,  thcu,  labbarb    Alcohol Level: No results found for this basename: ETH,  in the last 168 hours  CT of the brain  06/13/2013  Left frontal parenchymal hemorrhage adjacent to the falx. This is favored to represent hemorrhagic infarction however an underlying neoplasm is difficult to exclude based on the edema and mass effect extending into the genu of the corpus callosum.  MRI of the brain  06/14/2013  Left frontal lobe infarction with an intraparenchymal hematoma measuring approximately 4.7 x 1.7 cm, not significantly changed since the previous CT. Surrounding edema. Intraventricular penetration with the amount of intraventricular blood being the same. No hydrocephalus.  Chronic ischemic changes elsewhere throughout the brain, many associated with hemosiderin deposition indicating previous hemorrhagic infarctions. There is also hemosiderin deposition along with surface of the brain indicating previous subarachnoid hemorrhage.    MRA of the brain  06/14/2013   MRA does not show major vessel occlusion or correctable proximal stenosis. No evidence of aneurysm or high flow vascular malformation.     2D  Echocardiogram  The estimated ejection fraction was in the range of 55% to 60%. Wall motion was normal; there were no regional wall motion abnormalities.  Carotid Doppler  Bilateral carotid artery duplex: 1-39% ICA stenosis. Vertebral artery flow is antegrade.  CXR    EKG sinus  Therapy Recommendations SNF, CIR  Physical Exam   HEENT- Normocephalic, no lesions, without obvious abnormality. Normal external eye and conjunctiva. Normal TM's bilaterally. Normal auditory canals and external ears. Normal external nose, mucus membranes and septum. Normal pharynx.  Neck supple with no masses, nodes, nodules or enlargement.  Cardiovascular - regular rate and rhythm, S1, S2 normal, no murmur, click, rub or gallop  Lungs - chest clear, no wheezing, rales, normal symmetric air entry, Heart exam - S1, S2 normal, no murmur, no gallop, rate regular  Abdomen - soft, non-tender; bowel sounds normal; no masses, no organomegaly  Extremities - no joint deformities, effusion, or inflammation, no edema and no skin discoloration  Neurologic Examination:  Mental Status:  Alert, able to state few words at a time. Speech not fluent.  Cranial Nerves:  II-Visual fields were normal.  III/IV/VI-Pupils were equal and reacted. Extraocular movements were full and conjugate.  V/VII-no facial numbness; slight right lower facial weakness facial weakness.  VIII-normal.  X-patient was unable to speak, including no formation.  XII-midline tongue extension  Motor: 4+/5 b/l UE, 4/5 b/l LE Sensory: Normal throughout.  Deep Tendon Reflexes: 1+ and symmetric.  Plantars: Flexor bilaterally  Cerebellar: Normal finger-to-nose testing.  Carotid auscultation: Normal   ASSESSMENT [name] is a 80 y.o. female presenting with new onset speech difficulty. 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 Hemorrhage etiology unclear - differential includes amyloid angioopathy,  hypertension, vs hemorrhagic infarct.  On no antithrombotics prior to admission. Patient with resultant abulia, mild to no expressive aphasia and right leg > arm hemiparesis (improved). Work up completed.   Urinary retention  728, 803 cc on 2 separate occasions  Foley placed Hypertension - controlled etoh use  Hospital day # 3  TREATMENT/PLAN  Await Transfer to floor   Rehab consult  Burnetta Sabin, MSN, RN, ANVP-BC, ANP-BC, GNP-BC [facility] Pager: [phone] 06/16/2013 7:58 AM  I have personally obtained a history, examined the patient, evaluated imaging results, and formulated the assessment and plan of care. I agree with the above.  Antony Contras, MD

## 2013-06-16 NOTE — Consult Note (Signed)
Physical Medicine and Rehabilitation Consult  Reason for Consult: Speech difficulties, right sided weakness Referring Physician: Dr. Leonie Man.    HPI: [name] is a 80 y.o. female with history of HTN, melanoma; who was admitted via [facility] on 06/13/13 with two day history of difficulty speaking, confusion as well as difficulties with . CT head with left frontal parenchymal hemorrhage adjacent to the falx with mild mass effect and edema, layering in both lateral ventricles question hemorrhagic infarct. MRI brain with left frontal lobe infarction with an intraparenchymal hematoma measuring approximately 4.7 x 1.7 cm and surrounding edema. MRA head without occlusion, stenosis or aneurysm. Carotid doppler without significant ICA stenosis. Started on regular diet. Patient with resultant expressive aphasia with receptive deficits, decreased attention as well as difficulty with mobility. MD, ST recommending CIR.    Review of Systems  Unable to perform ROS: language     Past Medical History  Diagnosis Date  . Hypertension   . Arthritis   . Cancer     melanoma of nose    Past Surgical History  Procedure Laterality Date  . Abdominal hysterectomy      1982  . Cesarean section      x4  . Tonsillectomy      age 35  . Appendectomy      with 1 c-section  . Cataract extraction w/phaco  07/31/2011    Procedure: CATARACT EXTRACTION PHACO AND INTRAOCULAR LENS PLACEMENT (IOC);  Surgeon: Tonny Branch, MD;  Location: [facility];  Service: Ophthalmology;  Laterality: Right;  CDE: 11.31  . Cataract extraction w/phaco  08/14/2011    Procedure: CATARACT EXTRACTION PHACO AND INTRAOCULAR LENS PLACEMENT (IOC);  Surgeon: Tonny Branch, MD;  Location: [facility];  Service: Ophthalmology;  Laterality: Left;  CDE: 10.25    Family History  Problem Relation Age of Onset  . Anesthesia problems Neg Hx   . Hypotension Neg Hx   . Malignant hyperthermia Neg Hx   . Pseudochol deficiency Neg Hx     Social History:   reports that she has never smoked. She has quit using smokeless tobacco. She reports that she drinks alcohol. She reports that she does not use illicit drugs.   Allergies: No Known Allergies   Medications Prior to Admission  Medication Sig Dispense Refill  . acetaminophen (TYLENOL) 500 MG tablet Take 500 mg by mouth every 6 (six) hours as needed. For pain      . alendronate (FOSAMAX) 35 MG tablet Take 35 mg by mouth every 7 (seven) days. Takes on Sundays/Mondays      . clobetasol cream (TEMOVATE) [id number] % Apply 1 application topically 2 (two) times daily as needed. For rash      . FIBER SELECT GUMMIES CHEW Chew 1 tablet by mouth daily.      . flintstones complete (FLINTSTONES) 60 MG chewable tablet Chew 2 tablets by mouth daily.      Marland Kitchen lisinopril (PRINIVIL,ZESTRIL) 5 MG tablet Take 5 mg by mouth at bedtime.       . methotrexate (RHEUMATREX) 2.5 MG tablet Take 4 tablets by mouth once a week.      . naproxen (NAPROSYN) 250 MG tablet Take 250 mg by mouth 2 (two) times daily with a meal.       . triamcinolone ointment (KENALOG) 0.1 % Apply 1 application topically 2 (two) times daily as needed. For rash        Home: Home Living Family/patient expects to be discharged to:: Skilled nursing  facility Living Arrangements: Other relatives  Functional History:   Functional Status:  Mobility:          ADL:    Cognition: Cognition Overall Cognitive Status: Difficult to assess Arousal/Alertness: Lethargic Orientation Level: Disoriented to time;Disoriented to place;Disoriented to person;Disoriented to situation (intermittently oriented to place and situation.) Attention: Focused;Sustained;Selective Focused Attention: Appears intact Sustained Attention: Impaired Sustained Attention Impairment: Verbal basic;Functional basic Selective Attention: Impaired Selective Attention Impairment: Verbal basic;Functional basic Memory:  (unable to assess) Awareness:  Impaired Cognition Arousal/Alertness: Awake/alert Behavior During Therapy: Flat affect Overall Cognitive Status: Difficult to assess Difficult to assess due to: Impaired communication  Blood pressure 128/72, pulse 69, temperature 99.1 F (37.3 C), temperature source Axillary, resp. rate 19, height 5\' 5"  (1.651 m), weight 76.5 kg (168 lb 10.4 oz), SpO2 98.00%. Physical Exam  Nursing note and vitals reviewed. Constitutional: She appears well-developed and well-nourished.  HENT:  Head: Normocephalic and atraumatic.  Wears partials. Tongue with white coating.   Eyes: Conjunctivae are normal. Pupils are equal, round, and reactive to light.  Neck: Normal range of motion.  Cardiovascular: Normal rate and regular rhythm.   Respiratory: Effort normal. No respiratory distress. She has no wheezes.  GI: Soft. Bowel sounds are normal. She exhibits no distension. There is no tenderness.  Neurological: She is alert.  Non-verbal. Needs cues to make and maintain eye contact. Is able to follow simple one step commands. RUE and RLE weakness noted but very inconsistent. Appears to have expressive language difficulties with apraxia. Did nod her head a few times for me.  Skin: Skin is warm and dry.    No results found for this or any previous visit (from the past 24 hour(s)). Mr Jodene Nam Head Wo Contrast  06/14/2013   CLINICAL DATA:  Stroke.  Hypertension.  Speech disturbance.  EXAM: MRI HEAD WITHOUT CONTRAST  MRA HEAD WITHOUT CONTRAST  TECHNIQUE: Multiplanar, multiecho pulse sequences of the brain and surrounding structures were obtained without intravenous contrast. Angiographic images of the head were obtained using MRA technique without contrast.  COMPARISON:  Head CT 06/13/2013  FINDINGS: MRI HEAD FINDINGS  The brainstem and cerebellum are unremarkable. The cerebral hemispheres show a background pattern of atrophy and extensive chronic small vessel disease affecting the deep and subcortical white matter. There  is hemorrhagic infarction in the left frontal lobe. Hematoma size is not larger than on the previous CT, estimated at approximately 4.7 x 1.7 cm. The hematoma communicates with the left frontal formed and there is blood layering dependently in the lateral ventricles. This has not increased in amount. There are numerous other foci of hemosiderin deposition within the brain along the surfaces of the brain consistent with previous hemorrhagic infarctions and episodes of subarachnoid hemorrhage. After contrast administration, there is low level contrast enhancement along the margins of the left frontal infarction. The enhancement pattern does not suggest an underlying mass.  Ventricles do not appear obstructed. There is no extra-axial collection.  MRA HEAD FINDINGS  Both internal carotid arteries are patent into the brain. The anterior and middle cerebral vessels are patent without proximal stenosis, aneurysm or vascular malformation. Both vertebral arteries are patent to the basilar. No basilar stenosis. Posterior circulation branch vessels are patent with the right PCA taking a fetal origin from the anterior circulation.  IMPRESSION: Left frontal lobe infarction with an intraparenchymal hematoma measuring approximately 4.7 x 1.7 cm, not significantly changed since the previous CT. Surrounding edema. Intraventricular penetration with the amount of intraventricular blood being the same. No  hydrocephalus.  Chronic ischemic changes elsewhere throughout the brain, many associated with hemosiderin deposition indicating previous hemorrhagic infarctions. There is also hemosiderin deposition along with surface of the brain indicating previous subarachnoid hemorrhage.  MRA does not show major vessel occlusion or correctable proximal stenosis. No evidence of aneurysm or high flow vascular malformation.   Electronically Signed   By: Nelson Chimes M.D.   On: 06/14/2013 19:20   Mr Jeri Cos ZO Contrast  06/14/2013   CLINICAL DATA:   Stroke.  Hypertension.  Speech disturbance.  EXAM: MRI HEAD WITHOUT CONTRAST  MRA HEAD WITHOUT CONTRAST  TECHNIQUE: Multiplanar, multiecho pulse sequences of the brain and surrounding structures were obtained without intravenous contrast. Angiographic images of the head were obtained using MRA technique without contrast.  COMPARISON:  Head CT 06/13/2013  FINDINGS: MRI HEAD FINDINGS  The brainstem and cerebellum are unremarkable. The cerebral hemispheres show a background pattern of atrophy and extensive chronic small vessel disease affecting the deep and subcortical white matter. There is hemorrhagic infarction in the left frontal lobe. Hematoma size is not larger than on the previous CT, estimated at approximately 4.7 x 1.7 cm. The hematoma communicates with the left frontal formed and there is blood layering dependently in the lateral ventricles. This has not increased in amount. There are numerous other foci of hemosiderin deposition within the brain along the surfaces of the brain consistent with previous hemorrhagic infarctions and episodes of subarachnoid hemorrhage. After contrast administration, there is low level contrast enhancement along the margins of the left frontal infarction. The enhancement pattern does not suggest an underlying mass.  Ventricles do not appear obstructed. There is no extra-axial collection.  MRA HEAD FINDINGS  Both internal carotid arteries are patent into the brain. The anterior and middle cerebral vessels are patent without proximal stenosis, aneurysm or vascular malformation. Both vertebral arteries are patent to the basilar. No basilar stenosis. Posterior circulation branch vessels are patent with the right PCA taking a fetal origin from the anterior circulation.  IMPRESSION: Left frontal lobe infarction with an intraparenchymal hematoma measuring approximately 4.7 x 1.7 cm, not significantly changed since the previous CT. Surrounding edema. Intraventricular penetration with the  amount of intraventricular blood being the same. No hydrocephalus.  Chronic ischemic changes elsewhere throughout the brain, many associated with hemosiderin deposition indicating previous hemorrhagic infarctions. There is also hemosiderin deposition along with surface of the brain indicating previous subarachnoid hemorrhage.  MRA does not show major vessel occlusion or correctable proximal stenosis. No evidence of aneurysm or high flow vascular malformation.   Electronically Signed   By: Nelson Chimes M.D.   On: 06/14/2013 19:20    Assessment/Plan: Diagnosis: left frontal lobe infarct with IPH 1. Does the need for close, 24 hr/day medical supervision in concert with the patient's rehab needs make it unreasonable for this patient to be served in a less intensive setting? Yes 2. Co-Morbidities requiring supervision/potential complications: rotator cuff syndrome 3. Due to bladder management, bowel management, safety, skin/wound care, disease management, medication administration, pain management and patient education, does the patient require 24 hr/day rehab nursing? Yes 4. Does the patient require coordinated care of a physician, rehab nurse, PT (1-2 hrs/day, 5 days/week), OT (1-2 hrs/day, 5 days/week) and SLP (1-2 hrs/day, 5 days/week) to address physical and functional deficits in the context of the above medical diagnosis(es)? Yes Addressing deficits in the following areas: balance, endurance, locomotion, strength, transferring, bowel/bladder control, bathing, dressing, feeding, grooming, toileting, cognition, speech, language, swallowing and psychosocial support 5. Can  the patient actively participate in an intensive therapy program of at least 3 hrs of therapy per day at least 5 days per week? Yes 6. The potential for patient to make measurable gains while on inpatient rehab is good 7. Anticipated functional outcomes upon discharge from inpatient rehab are min assist with PT, min assist with OT, min  assist with SLP. 8. Estimated rehab length of stay to reach the above functional goals is: 15-20 days 9. Does the patient have adequate social supports to accommodate these discharge functional goals? Potentially 10. Anticipated D/C setting: Home 11. Anticipated post D/C treatments: Economy therapy 12. Overall Rehab/Functional Prognosis: good  RECOMMENDATIONS: This patient's condition is appropriate for continued rehabilitative care in the following setting: CIR Patient has agreed to participate in recommended program. n/a Note that insurance prior authorization may be required for reimbursement for recommended care.  Comment: Pt would benefit from an intensive inpatient program. Need to define social supports, however. If no assistance at home upon dc, we might be able to still justify a rehab admission based on reducing the burden of care for her next venue. Rehab Admissions Coordinator to follow up.  Thanks,  Meredith Staggers, MD, Mellody Drown     06/16/2013

## 2013-06-17 DIAGNOSIS — I619 Nontraumatic intracerebral hemorrhage, unspecified: Secondary | ICD-10-CM

## 2013-06-17 NOTE — Progress Notes (Addendum)
Speech Language Pathology Treatment: Cognitive-Linquistic  Patient Details Name: [name] MRN: [id number] DOB: 1930/04/20 Today's Date: 06/17/2013 Time: 0821-0840 SLP Time Calculation (min): 19 min  Assessment / Plan / Recommendation Clinical Impression  Treatment session focused on maximizing pt's verbal communication.  Pt today able to answer yes/no questions re: personal information/address, etc  with 100% accuracy - delayed responses noted.  Automatic speech tasks including stating numbers 1-10 and months of year with modified independence with min assist to initiate.     She responded to personal questions inconsistently - approximately 40% of opportunities toward end of session - pt indicated pain in eye and therefore SLP ended session and informed RN of pt pain.  Pt with significantly delayed responses likely due to frontal lobe involvement- looking away from therapist to right side x3 during tx.  Flat affect noted throughout evaluation.   Pt followed one step directions today with modified independence!  She was able to verbalize she was in the hospital, month of birth and medical dx.  Pt smiled appropriately x3 during session.    SLP to advance goals due to rapid improvement.  Pt demonstrating improved expressive automatic skills but demonstrated inconsistently responses to SLP questions.    Will continue SLP to facilitate pt's functional communication to decrease caregiver burden and allow pt participation in medical care.          HPI HPI: 80 y.o. female a history of hypertension and arthritis who presented to the emergency room at [facility] with inability to speak. Onset is unclear. Patient apparently has been symptomatic for past 2 days with speech difficulty and apparent confusion. Has no previous history of stroke nor TIA. She has not been on antiplatelet therapy. CT scan of the head showed left parenchymal hemorrhage adjacent to the falx. There was minimal  extension of hemorrhage into left and right lateral ventricles. This was resumed hemorrhagic infarction. However, mass lesion cannot be ruled out. Her NIH stroke score was 8. Patient was transferred to [facility].  Pt with left frontal infarct with hematoma.  Speech to see pt for cognitive-language tx.     Pertinent Vitals Afebrile, decreased  SLP Plan  Continue with current plan of care    Recommendations  CIR              General recommendations: Rehab consult Oral Care Recommendations: Oral care BID Follow up Recommendations: Inpatient Rehab Plan: Continue with current plan of care    [place], [place], [facility] SLP [phone]

## 2013-06-17 NOTE — Progress Notes (Signed)
Physical Therapy Treatment Patient Details Name: [name] MRN: [id number] DOB: 1929-07-04 Today's Date: 06/17/2013 Time: 4098-1191 PT Time Calculation (min): 22 min  PT Assessment / Plan / Recommendation  History of Present Illness Pt presenting with symptoms, generalized weakness and decreased verbalization,  x 2 days PTA.  Pt admitted with dx of ICH, Broca's aphasia.   PT Comments   Pt with increased activity today, though requires 2nd person for safety.  Will continue to follow.    Follow Up Recommendations  SNF     Does the patient have the potential to tolerate intense rehabilitation     Barriers to Discharge        Equipment Recommendations  None recommended by PT    Recommendations for Other Services    Frequency Min 3X/week   Progress towards PT Goals Progress towards PT goals: Progressing toward goals  Plan Current plan remains appropriate    Precautions / Restrictions Restrictions Weight Bearing Restrictions: No   Pertinent Vitals/Pain Did not indicate pain.      Mobility  Bed Mobility Overal bed mobility: Needs Assistance Bed Mobility: Supine to Sit Supine to sit: Mod assist General bed mobility comments: cues for sequencing and technique.   Transfers Overall transfer level: Needs assistance Equipment used: 2 person hand held assist Transfers: Sit to/from [facility] Sit to Stand: Max assist;+2 physical assistance Stand pivot transfers: Max assist;+2 physical assistance General transfer comment: pt needs increased time, verbal and tactile cues, and A to bring R LE around for pivot.  Repeated standing x2 for peri hygiene with 3rd person present to perform.      Exercises     PT Diagnosis:    PT Problem List:   PT Treatment Interventions:     PT Goals (current goals can now be found in the care plan section) Acute Rehab PT Goals Patient Stated Goal: unable to state Time For Goal Achievement: 06/28/13 Potential to Achieve Goals:  Fair  Visit Information  Last PT Received On: 06/17/13 Assistance Needed: +2 History of Present Illness: Pt presenting with symptoms, generalized weakness and decreased verbalization,  x 2 days PTA.  Pt admitted with dx of ICH, Broca's aphasia.    Subjective Data  Patient Stated Goal: unable to state   Cognition  Cognition Arousal/Alertness: Awake/alert Behavior During Therapy: Flat affect Overall Cognitive Status: Impaired/Different from baseline Area of Impairment: Attention;Following commands;Safety/judgement;Awareness;Problem solving Current Attention Level: Sustained Following Commands: Follows one step commands inconsistently Safety/Judgement: Decreased awareness of safety;Decreased awareness of deficits Awareness: Intellectual Problem Solving: Slow processing;Decreased initiation;Difficulty sequencing;Requires verbal cues;Requires tactile cues General Comments: lack of interest in activities. Minimal verbalizations.      Balance  Balance Overall balance assessment: Needs assistance Sitting-balance support: Single extremity supported;Feet supported Sitting balance-[name]: Poor Sitting balance - Comments: Attempted reaching activity with single UE and pt requiring MinA to maintain balance with minimal reaching.   Standing balance support: Bilateral upper extremity supported Standing balance-[name]: Zero  End of Session PT - End of Session Equipment Utilized During Treatment: Gait belt Activity Tolerance: Patient limited by fatigue Patient left: in chair;with call bell/phone within reach;with chair alarm set;with nursing/sitter in room Nurse Communication: Mobility status   GP     [name], [name] 06/17/2013, 11:20 AM

## 2013-06-17 NOTE — Clinical Social Work Placement (Signed)
Clinical Social Work Department CLINICAL SOCIAL WORK PLACEMENT NOTE 06/17/2013  Patient:  ENZLEY, KITCHENS  Account Number:  [id number] Admit date:  06/13/2013  Clinical Social Worker:  Raquel Sarna [place], LCSWA  Date/time:  06/17/2013 03:05 PM  Clinical Social Work is seeking post-discharge placement for this patient at the following level of care:   SKILLED NURSING   (*CSW will update this form in Epic as items are completed)   06/17/2013  Patient/family provided with [place] Department of Clinical Social Works list of facilities offering this level of care within the geographic area requested by the patient (or if unable, by the patients family).  06/17/2013  Patient/family informed of their freedom to choose among providers that offer the needed level of care, that participate in Medicare, Medicaid or managed care program needed by the patient, have an available bed and are willing to accept the patient.  06/17/2013  Patient/family informed of [facility] ownership interest in [facility], as well as of the fact that they are under no obligation to receive care at this facility.  PASARR submitted to EDS on 06/17/2013 PASARR number received from EDS on 06/17/2013  FL2 transmitted to all facilities in geographic area requested by pt/family on  06/17/2013 FL2 transmitted to all facilities within larger geographic area on   Patient informed that his/her managed care company has contracts with or will negotiate with  certain facilities, including the following:     Patient/family informed of bed offers received:   Patient chooses bed at  Physician recommends and patient chooses bed at    Patient to be transferred to  on   Patient to be transferred to facility by   The following physician request were entered in Epic:   Additional Comments: Family prefers CIR before SNF. CSW following for possible SNF placement if not able to be admitted to CIR.  Pati Gallo, [place] Social Worker [phone]

## 2013-06-17 NOTE — Progress Notes (Signed)
Stroke Team Progress Note  HISTORY 80 y.o. female a history of hypertension and arthritis who presented to the emergency room at [facility] 06/13/2013 with inability to speak. Onset is unclear. Patient apparently has been symptomatic for past 2 days with speech difficulty and apparent confusion. Has no previous history of stroke nor TIA. She has not been on antiplatelet therapy. CT scan of the head showed left parenchymal hemorrhage adjacent to the falx. There was minimal extension of hemorrhage into left and right lateral ventricles. This was resumed hemorrhagic infarction. However, mass lesion cannot be ruled out. Her NIH stroke score was 8. Patient was transferred to [facility] for further management. She was not a tPA candidate due to delay in arrival.   SUBJECTIVE Her family is at the bedsdie.  OBJECTIVE Most recent Vital Signs: Filed Vitals:   06/16/13 2230 06/17/13 0219 06/17/13 0543 06/17/13 1100  BP: 146/70 152/71 143/46 124/42  Pulse: 77 83 73 74  Temp: 98.7 F (37.1 C) 99.1 F (37.3 C) 98 F (36.7 C) 98.1 F (36.7 C)  TempSrc: Oral Oral Axillary Axillary  Resp: 18 16 18 18   Height:      Weight:      SpO2: 98% 97% 97% 99%   CBG (last 3)  No results found for this basename: GLUCAP,  in the last 72 hours  IV Fluid Intake:   . sodium chloride 75 mL/hr at 06/16/13 1905    MEDICATIONS  . antiseptic oral rinse  15 mL Mouth Rinse BID  . lisinopril  5 mg Oral QHS  . ondansetron  4 mg Intramuscular Once  . pantoprazole  40 mg Oral QHS  . senna-docusate  1 tablet Oral BID   PRN:  acetaminophen, acetaminophen, labetalol, traMADol  Diet:  General thin liquids Activity:  Up with assistance DVT Prophylaxis:  SCDs  CLINICALLY SIGNIFICANT STUDIES Basic Metabolic Panel:   Recent Labs Lab 06/13/13 1413  NA 141  K 3.9  CL 104  CO2 25  GLUCOSE 95  BUN 16  CREATININE 0.91  CALCIUM 9.0   Liver Function Tests:   Recent Labs Lab 06/13/13 1413  AST 20  ALT 15   ALKPHOS 41  BILITOT 0.6  PROT 7.2  ALBUMIN 3.9   CBC:   Recent Labs Lab 06/13/13 1413  WBC 7.7  NEUTROABS 6.2  HGB 12.3  HCT 37.2  MCV 101.6*  PLT 199   Coagulation:   Recent Labs Lab 06/13/13 1524  LABPROT 12.6  INR 0.96   Cardiac Enzymes: No results found for this basename: CKTOTAL, CKMB, CKMBINDEX, TROPONINI,  in the last 168 hours Urinalysis:   Recent Labs Lab 06/13/13 2114  COLORURINE YELLOW  LABSPEC 1.017  PHURINE 7.5  GLUCOSEU NEGATIVE  HGBUR NEGATIVE  BILIRUBINUR NEGATIVE  KETONESUR 40*  PROTEINUR NEGATIVE  UROBILINOGEN 0.2  NITRITE NEGATIVE  LEUKOCYTESUR NEGATIVE   Lipid Panel    Component Value Date/Time   CHOL 193 06/14/2013 1040   HgbA1C  Lab Results  Component Value Date   HGBA1C 5.7* 06/14/2013    Urine Drug Screen:   No results found for this basename: labopia,  cocainscrnur,  labbenz,  amphetmu,  thcu,  labbarb    Alcohol Level: No results found for this basename: ETH,  in the last 168 hours  CT of the brain  06/13/2013  Left frontal parenchymal hemorrhage adjacent to the falx. This is favored to represent hemorrhagic infarction however an underlying neoplasm is difficult to exclude based on the edema and mass  effect extending into the genu of the corpus callosum.  MRI of the brain  06/14/2013  Left frontal lobe infarction with an intraparenchymal hematoma measuring approximately 4.7 x 1.7 cm, not significantly changed since the previous CT. Surrounding edema. Intraventricular penetration with the amount of intraventricular blood being the same. No hydrocephalus.  Chronic ischemic changes elsewhere throughout the brain, many associated with hemosiderin deposition indicating previous hemorrhagic infarctions. There is also hemosiderin deposition along with surface of the brain indicating previous subarachnoid hemorrhage.    MRA of the brain  06/14/2013   MRA does not show major vessel occlusion or correctable proximal stenosis. No evidence of  aneurysm or high flow vascular malformation.     2D Echocardiogram  The estimated ejection fraction was in the range of 55% to 60%. Wall motion was normal; there were no regional wall motion abnormalities.  Carotid Doppler  Bilateral carotid artery duplex: 1-39% ICA stenosis. Vertebral artery flow is antegrade.  EKG sinus  Therapy Recommendations SNF, CIR  Physical Exam   HEENT- Normocephalic, no lesions, without obvious abnormality. Normal external eye and conjunctiva. Normal TM's bilaterally. Normal auditory canals and external ears. Normal external nose, mucus membranes and septum. Normal pharynx.  Neck supple with no masses, nodes, nodules or enlargement.  Cardiovascular - regular rate and rhythm, S1, S2 normal, no murmur, click, rub or gallop  Lungs - chest clear, no wheezing, rales, normal symmetric air entry, Heart exam - S1, S2 normal, no murmur, no gallop, rate regular  Abdomen - soft, non-tender; bowel sounds normal; no masses, no organomegaly  Extremities - no joint deformities, effusion, or inflammation, no edema and no skin discoloration  Neurologic Examination:  Mental Status:  Alert, able to state few words at a time. Speech not fluent.  Cranial Nerves:  II-Visual fields were normal.  III/IV/VI-Pupils were equal and reacted. Extraocular movements were full and conjugate.  V/VII-no facial numbness; slight right lower facial weakness facial weakness.  VIII-normal.  X-patient was unable to speak, including no formation.  XII-midline tongue extension  Motor: 4+/5 b/l UE, 4/5 b/l LE Sensory: Normal throughout.  Deep Tendon Reflexes: 1+ and symmetric.  Plantars: Flexor bilaterally  Cerebellar: Normal finger-to-nose testing.  Carotid auscultation: Normal   ASSESSMENT [name] is a 80 y.o. female presenting with new onset speech difficulty. 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 Hemorrhage etiology unclear -  differential includes amyloid angioopathy, hypertension, vs hemorrhagic infarct.  On no antithrombotics prior to admission. Patient with resultant abulia, mild to no expressive aphasia and right leg > arm hemiparesis (improved). Work up completed.   Urinary retention  728, 803 cc on 2 separate occasions  Foley placed Hypertension - controlled etoh use  Hospital day # 4  TREATMENT/PLAN  Rehab consult - they recommend CIR placement. Admissions coordinator is following.   She is medically ready for discharge when bed available either in CIR or at [facility]. (family considering [facility] just because it is near her home).  Discontinue IVF  Burnetta Sabin, MSN, RN, ANVP-BC, ANP-BC, GNP-BC [facility] Pager: [phone] 06/17/2013 12:15 PM  I have personally obtained a history, examined the patient, evaluated imaging results, and formulated the assessment and plan of care. I agree with the above. Antony Contras, MD

## 2013-06-17 NOTE — Progress Notes (Signed)
IP Rehab I met with pt and family [name], the nephew; son [name], pt's sister [name] ) at the bedside.  Pt's daughter [name] was on speaker phone and part of the conversation.  They would like for me to pursue insurance approval for CIR admission.  Please call if questions.    Gerlean Ren, [place]

## 2013-06-18 NOTE — Progress Notes (Signed)
Stroke Team Progress Note  HISTORY 80 y.o. female a history of hypertension and arthritis who presented to the emergency room at [facility] 06/13/2013 with inability to speak. Onset is unclear. Patient apparently has been symptomatic for past 2 days with speech difficulty and apparent confusion. Has no previous history of stroke nor TIA. She has not been on antiplatelet therapy. CT scan of the head showed left parenchymal hemorrhage adjacent to the falx. There was minimal extension of hemorrhage into left and right lateral ventricles. This was resumed hemorrhagic infarction. However, mass lesion cannot be ruled out. Her NIH stroke score was 8. Patient was transferred to [facility] for further management. She was not a tPA candidate due to delay in arrival.   SUBJECTIVE No family at bedside. Pt lying in the bed.  OBJECTIVE Most recent Vital Signs: Filed Vitals:   06/17/13 1640 06/17/13 2124 06/18/13 0219 06/18/13 0506  BP: 138/59 150/58 147/48 140/52  Pulse: 76 70 77 71  Temp: 98.5 F (36.9 C) 98.8 F (37.1 C) 99.9 F (37.7 C) 99.4 F (37.4 C)  TempSrc: Oral Oral Oral Oral  Resp: 18 18 18 20   Height:      Weight:      SpO2: 99% 98% 96% 95%   CBG (last 3)  No results found for this basename: GLUCAP,  in the last 72 hours  IV Fluid Intake:      MEDICATIONS  . antiseptic oral rinse  15 mL Mouth Rinse BID  . lisinopril  5 mg Oral QHS  . ondansetron  4 mg Intramuscular Once  . senna-docusate  1 tablet Oral BID   PRN:  acetaminophen, acetaminophen, labetalol, traMADol  Diet:  General thin liquids Activity:  Up with assistance DVT Prophylaxis:  SCDs  CLINICALLY SIGNIFICANT STUDIES Basic Metabolic Panel:   Recent Labs Lab 06/13/13 1413  NA 141  K 3.9  CL 104  CO2 25  GLUCOSE 95  BUN 16  CREATININE 0.91  CALCIUM 9.0   Liver Function Tests:   Recent Labs Lab 06/13/13 1413  AST 20  ALT 15  ALKPHOS 41  BILITOT 0.6  PROT 7.2  ALBUMIN 3.9   CBC:   Recent  Labs Lab 06/13/13 1413  WBC 7.7  NEUTROABS 6.2  HGB 12.3  HCT 37.2  MCV 101.6*  PLT 199   Coagulation:   Recent Labs Lab 06/13/13 1524  LABPROT 12.6  INR 0.96   Cardiac Enzymes: No results found for this basename: CKTOTAL, CKMB, CKMBINDEX, TROPONINI,  in the last 168 hours Urinalysis:   Recent Labs Lab 06/13/13 2114  COLORURINE YELLOW  LABSPEC 1.017  PHURINE 7.5  GLUCOSEU NEGATIVE  HGBUR NEGATIVE  BILIRUBINUR NEGATIVE  KETONESUR 40*  PROTEINUR NEGATIVE  UROBILINOGEN 0.2  NITRITE NEGATIVE  LEUKOCYTESUR NEGATIVE   Lipid Panel    Component Value Date/Time   CHOL 193 06/14/2013 1040   HgbA1C  Lab Results  Component Value Date   HGBA1C 5.7* 06/14/2013    Urine Drug Screen:   No results found for this basename: labopia,  cocainscrnur,  labbenz,  amphetmu,  thcu,  labbarb    Alcohol Level: No results found for this basename: ETH,  in the last 168 hours  CT of the brain  06/13/2013  Left frontal parenchymal hemorrhage adjacent to the falx. This is favored to represent hemorrhagic infarction however an underlying neoplasm is difficult to exclude based on the edema and mass effect extending into the genu of the corpus callosum.  MRI of the  brain  06/14/2013  Left frontal lobe infarction with an intraparenchymal hematoma measuring approximately 4.7 x 1.7 cm, not significantly changed since the previous CT. Surrounding edema. Intraventricular penetration with the amount of intraventricular blood being the same. No hydrocephalus.  Chronic ischemic changes elsewhere throughout the brain, many associated with hemosiderin deposition indicating previous hemorrhagic infarctions. There is also hemosiderin deposition along with surface of the brain indicating previous subarachnoid hemorrhage.    MRA of the brain  06/14/2013   MRA does not show major vessel occlusion or correctable proximal stenosis. No evidence of aneurysm or high flow vascular malformation.     2D Echocardiogram  The  estimated ejection fraction was in the range of 55% to 60%. Wall motion was normal; there were no regional wall motion abnormalities.  Carotid Doppler  Bilateral carotid artery duplex: 1-39% ICA stenosis. Vertebral artery flow is antegrade.  EKG sinus  Therapy Recommendations SNF, CIR  Physical Exam   HEENT- Normocephalic, no lesions, without obvious abnormality. Normal external eye and conjunctiva. Normal TM's bilaterally. Normal auditory canals and external ears. Normal external nose, mucus membranes and septum. Normal pharynx.  Neck supple with no masses, nodes, nodules or enlargement.  Cardiovascular - regular rate and rhythm, S1, S2 normal, no murmur, click, rub or gallop  Lungs - chest clear, no wheezing, rales, normal symmetric air entry, Heart exam - S1, S2 normal, no murmur, no gallop, rate regular  Abdomen - soft, non-tender; bowel sounds normal; no masses, no organomegaly  Extremities - no joint deformities, effusion, or inflammation, no edema and no skin discoloration  Neurologic Examination:  Mental Status:  Alert, able to state few words at a time. Speech not fluent.  Cranial Nerves:  II-Visual fields were normal.  III/IV/VI-Pupils were equal and reacted. Extraocular movements were full and conjugate.  V/VII-no facial numbness; slight right lower facial weakness facial weakness.  VIII-normal.  X-patient was unable to speak, including no formation.  XII-midline tongue extension  Motor: 4+/5 b/l UE, 4/5 b/l LE Sensory: Normal throughout.  Deep Tendon Reflexes: 1+ and symmetric.  Plantars: Flexor bilaterally  Cerebellar: Normal finger-to-nose testing.  Carotid auscultation: Normal   ASSESSMENT [name] is a 80 y.o. female presenting with new onset speech difficulty. 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 Hemorrhage etiology unclear - differential includes amyloid angioopathy, hypertension, vs hemorrhagic  infarct.  On no antithrombotics prior to admission. Patient with resultant abulia, mild to no expressive aphasia and right leg > arm hemiparesis (improved). Work up completed. Patient remains stable   Urinary retention  728, 803 cc on 2 separate occasions  Foley placed Hypertension - controlled etoh use  Hospital day # 5  TREATMENT/PLAN  Await insurance approval for IP Rehab. Admissions coordinator is following.   She is medically ready for discharge when bed available either in CIR or at SNF.   Burnetta Sabin, MSN, RN, ANVP-BC, ANP-BC, [facility] Pager: [phone] 06/18/2013 10:08 AM  I have personally obtained a history, examined the patient, evaluated imaging results, and formulated the assessment and plan of care. I agree with the above.  Jim Like, DO Neurology-Stroke

## 2013-06-18 NOTE — Progress Notes (Signed)
Inpt. Rehab  I just received a denial for inpatient rehab from pt's [facility].  I have phoned daughter Philip Aspen and left her a voice mail to this effect.  She has my phone number if she has any questions.  I have notified SW/CM as well.  Please call if questions.  Gerlean Ren PT [phone]

## 2013-06-19 ENCOUNTER — Inpatient Hospital Stay
Admission: RE | Admit: 2013-06-19 | Discharge: 2013-07-25 | Disposition: A | Discharge status: Nursing Home (Includes ICF) | Payer: Medicare HMO | Source: Ambulatory Visit | Attending: Internal Medicine | Admitting: Internal Medicine

## 2013-06-19 ENCOUNTER — Non-Acute Institutional Stay (SKILLED_NURSING_FACILITY): Payer: Medicare HMO | Admitting: Internal Medicine

## 2013-06-19 DIAGNOSIS — R339 Retention of urine, unspecified: Secondary | ICD-10-CM

## 2013-06-19 DIAGNOSIS — I10 Essential (primary) hypertension: Secondary | ICD-10-CM

## 2013-06-19 DIAGNOSIS — M79672 Pain in left foot: Secondary | ICD-10-CM

## 2013-06-19 DIAGNOSIS — T148XXA Other injury of unspecified body region, initial encounter: Principal | ICD-10-CM

## 2013-06-19 DIAGNOSIS — M129 Arthropathy, unspecified: Secondary | ICD-10-CM

## 2013-06-19 DIAGNOSIS — I619 Nontraumatic intracerebral hemorrhage, unspecified: Secondary | ICD-10-CM

## 2013-06-19 MED ORDER — TAMSULOSIN HCL 0.4 MG PO CAPS
0.4000 mg | ORAL_CAPSULE | Freq: Every day | ORAL | Status: DC
  Administered 2013-06-19: 0.4 mg via ORAL
  Filled 2013-06-19: qty 1

## 2013-06-19 MED ORDER — TAMSULOSIN HCL 0.4 MG PO CAPS: 0.4000 mg | ORAL_CAPSULE | Freq: Every day | ORAL | Status: DC

## 2013-06-19 MED ORDER — SENNOSIDES-DOCUSATE SODIUM 8.6-50 MG PO TABS: 1.0000 | ORAL_TABLET | Freq: Two times a day (BID) | ORAL | Status: DC

## 2013-06-19 NOTE — Progress Notes (Signed)
Lou Cal, RN from [facility] called to get report on Patient.  She was informed about daughter wanting them to have her information since she is her POA.  She is aware of patient unable to void since foley was removed this morning at 1015.

## 2013-06-19 NOTE — Progress Notes (Signed)
Spoke with Dr Janann Colonel reguarding foley catheter in for urine retention. He stated to trial removal again and if she has retention he would start flomax at that point. Catheter removed and pt with no complaints. Will continue to monitor.

## 2013-06-19 NOTE — Progress Notes (Signed)
Pt. DC'd to [facility] via ambulance.  Vital signs and assessments were stable.

## 2013-06-19 NOTE — Clinical Social Work Note (Signed)
CSW has confirmed bed availability and insurance approval for pt to be discharged to [facility] on 06/19/2013. RN has called pt's daughter to notify that pt is being transferred on 06/19/2013. CSW has faxed discharge summary to [facility]. CSW to complete discharge packet and place on pt's shadow chart. CSW to arrange for ambulance transportation for pt.   RN please call report to [facility] at [phone] Ambulance [facility]) [place], Story Worker [phone]

## 2013-06-19 NOTE — Progress Notes (Addendum)
Stroke Team Progress Note  HISTORY 80 y.o. female a history of hypertension and arthritis who presented to the emergency room at [facility] 06/13/2013 with inability to speak. Onset is unclear. Patient apparently has been symptomatic for past 2 days with speech difficulty and apparent confusion. Has no previous history of stroke nor TIA. She has not been on antiplatelet therapy. CT scan of the head showed left parenchymal hemorrhage adjacent to the falx. There was minimal extension of hemorrhage into left and right lateral ventricles. This was resumed hemorrhagic infarction. However, mass lesion cannot be ruled out. Her NIH stroke score was 8. Patient was transferred to [facility] for further management. She was not a tPA candidate due to delay in arrival.   SUBJECTIVE No family at bedside. Pt sitting up in chair. Foley removed today.   OBJECTIVE Most recent Vital Signs: Filed Vitals:   06/18/13 2130 06/19/13 0134 06/19/13 0546 06/19/13 1040  BP: 119/65 122/54 129/46 123/49  Pulse: 80  66 69  Temp: 97.8 F (36.6 C) 98.1 F (36.7 C) 98.1 F (36.7 C) 98 F (36.7 C)  TempSrc: Oral Oral Oral Oral  Resp: 18 18 20 18   Height:      Weight:      SpO2: 95% 95% 99% 99%   CBG (last 3)  No results found for this basename: GLUCAP,  in the last 72 hours  IV Fluid Intake:      MEDICATIONS  . antiseptic oral rinse  15 mL Mouth Rinse BID  . lisinopril  5 mg Oral QHS  . ondansetron  4 mg Intramuscular Once  . senna-docusate  1 tablet Oral BID   PRN:  acetaminophen, acetaminophen, labetalol, traMADol  Diet:  General thin liquids Activity:  Up with assistance DVT Prophylaxis:  SCDs  CLINICALLY SIGNIFICANT STUDIES Basic Metabolic Panel:   Recent Labs Lab 06/13/13 1413  NA 141  K 3.9  CL 104  CO2 25  GLUCOSE 95  BUN 16  CREATININE 0.91  CALCIUM 9.0   Liver Function Tests:   Recent Labs Lab 06/13/13 1413  AST 20  ALT 15  ALKPHOS 41  BILITOT 0.6  PROT 7.2  ALBUMIN 3.9    CBC:   Recent Labs Lab 06/13/13 1413  WBC 7.7  NEUTROABS 6.2  HGB 12.3  HCT 37.2  MCV 101.6*  PLT 199   Coagulation:   Recent Labs Lab 06/13/13 1524  LABPROT 12.6  INR 0.96   Cardiac Enzymes: No results found for this basename: CKTOTAL, CKMB, CKMBINDEX, TROPONINI,  in the last 168 hours Urinalysis:   Recent Labs Lab 06/13/13 2114  COLORURINE YELLOW  LABSPEC 1.017  PHURINE 7.5  GLUCOSEU NEGATIVE  HGBUR NEGATIVE  BILIRUBINUR NEGATIVE  KETONESUR 40*  PROTEINUR NEGATIVE  UROBILINOGEN 0.2  NITRITE NEGATIVE  LEUKOCYTESUR NEGATIVE   Lipid Panel    Component Value Date/Time   CHOL 193 06/14/2013 1040   HgbA1C  Lab Results  Component Value Date   HGBA1C 5.7* 06/14/2013    Urine Drug Screen:   No results found for this basename: labopia,  cocainscrnur,  labbenz,  amphetmu,  thcu,  labbarb    Alcohol Level: No results found for this basename: ETH,  in the last 168 hours  CT of the brain  06/13/2013  Left frontal parenchymal hemorrhage adjacent to the falx. This is favored to represent hemorrhagic infarction however an underlying neoplasm is difficult to exclude based on the edema and mass effect extending into the genu of the corpus callosum.  MRI of the brain  06/14/2013  Left frontal lobe infarction with an intraparenchymal hematoma measuring approximately 4.7 x 1.7 cm, not significantly changed since the previous CT. Surrounding edema. Intraventricular penetration with the amount of intraventricular blood being the same. No hydrocephalus.  Chronic ischemic changes elsewhere throughout the brain, many associated with hemosiderin deposition indicating previous hemorrhagic infarctions. There is also hemosiderin deposition along with surface of the brain indicating previous subarachnoid hemorrhage.    MRA of the brain  06/14/2013   MRA does not show major vessel occlusion or correctable proximal stenosis. No evidence of aneurysm or high flow vascular malformation.     2D  Echocardiogram  The estimated ejection fraction was in the range of 55% to 60%. Wall motion was normal; there were no regional wall motion abnormalities.  Carotid Doppler  Bilateral carotid artery duplex: 1-39% ICA stenosis. Vertebral artery flow is antegrade.  EKG sinus  Therapy Recommendations SNF, CIR  Physical Exam   HEENT- Normocephalic, no lesions, without obvious abnormality. Normal external eye and conjunctiva. Normal TM's bilaterally. Normal auditory canals and external ears. Normal external nose, mucus membranes and septum. Normal pharynx.  Neck supple with no masses, nodes, nodules or enlargement.  Cardiovascular - regular rate and rhythm, S1, S2 normal, no murmur, click, rub or gallop  Lungs - chest clear, no wheezing, rales, normal symmetric air entry, Heart exam - S1, S2 normal, no murmur, no gallop, rate regular  Abdomen - soft, non-tender; bowel sounds normal; no masses, no organomegaly  Extremities - no joint deformities, effusion, or inflammation, no edema and no skin discoloration  Neurologic Examination:  Mental Status:  Alert, able to state few words at a time. Speech not fluent.  Cranial Nerves:  II-Visual fields were normal.  III/IV/VI-Pupils were equal and reacted. Extraocular movements were full and conjugate.  V/VII-no facial numbness; slight right lower facial weakness facial weakness.  VIII-normal.  X-patient was unable to speak, including no formation.  XII-midline tongue extension  Motor: 4+/5 b/l UE, 4/5 b/l LE Sensory: Normal throughout.  Deep Tendon Reflexes: 1+ and symmetric.  Plantars: Flexor bilaterally  Cerebellar: Normal finger-to-nose testing.  Carotid auscultation: Normal   ASSESSMENT [name] is a 80 y.o. female presenting with new onset speech difficulty. 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 Hemorrhage etiology unclear - differential includes amyloid angioopathy, hypertension,  vs hemorrhagic infarct.  On no antithrombotics prior to admission. Patient with resultant abulia, mild to no expressive aphasia and right leg > arm hemiparesis (improved). Work up completed. Patient remains stable   Urinary retention  728, 803 cc on 2 separate occasions Hypertension - controlled etoh use  Hospital day # 6  TREATMENT/PLAN  Await insurance approval for IP Rehab. Admissions coordinator is following.   She is medically ready for discharge when bed available either in CIR or at SNF.   Foley removed, will start Flomax    I have personally obtained a history, examined the patient, evaluated imaging results, and formulated the assessment and plan of care. I agree with the above.  Jim Like, DO Neurology-Stroke

## 2013-06-19 NOTE — Progress Notes (Signed)
Physical Therapy Treatment Patient Details Name: [name] MRN: [id number] DOB: April 20, 1930 Today's Date: 06/19/2013 Time: 6195-0932 PT Time Calculation (min): 26 min  PT Assessment / Plan / Recommendation  History of Present Illness Pt presenting with symptoms, generalized weakness and decreased verbalization,  x 2 days PTA.  Pt admitted with dx of ICH, Broca's aphasia.   PT Comments   Patient continues to be limited by cognitive status and weakness. When asked questions this session patient would just laughed. Patient soiled upon standing. Stood x3 with assistance for balance and pericare  Follow Up Recommendations  SNF     Does the patient have the potential to tolerate intense rehabilitation     Barriers to Discharge        Equipment Recommendations  None recommended by PT    Recommendations for Other Services    Frequency Min 3X/week   Progress towards PT Goals Progress towards PT goals: Progressing toward goals  Plan Current plan remains appropriate    Precautions / Restrictions Precautions Precautions: Fall   Pertinent Vitals/Pain no apparent distress     Mobility  Bed Mobility Overal bed mobility: Needs Assistance Bed Mobility: Supine to Sit Supine to sit: Mod assist;+2 for physical assistance Sit to supine: Mod assist;+2 for physical assistance General bed mobility comments: cues for sequencing and technique.  A for LEs and trunk into sitting Transfers Overall transfer level: Needs assistance Equipment used: 2 person hand held assist Sit to Stand: Max assist;+2 physical assistance Stand pivot transfers: Max assist;+2 physical assistance General transfer comment: pt needs increased time, verbal and tactile cues, and A to bring R LE around for pivot.  Repeated standing x3 for peri hygiene with 3rd person present to perform.      Exercises     PT Diagnosis:    PT Problem List:   PT Treatment Interventions:     PT Goals (current goals can now be found in  the care plan section)    Visit Information  Last PT Received On: 06/19/13 Assistance Needed: +2 History of Present Illness: Pt presenting with symptoms, generalized weakness and decreased verbalization,  x 2 days PTA.  Pt admitted with dx of ICH, Broca's aphasia.    Subjective Data      Cognition  Cognition Arousal/Alertness: Awake/alert Behavior During Therapy: Flat affect Overall Cognitive Status: Impaired/Different from baseline Area of Impairment: Attention;Following commands;Safety/judgement;Awareness;Problem solving Current Attention Level: Sustained Following Commands: Follows one step commands inconsistently Safety/Judgement: Decreased awareness of safety;Decreased awareness of deficits Problem Solving: Slow processing;Decreased initiation;Difficulty sequencing;Requires verbal cues;Requires tactile cues General Comments: lack of interest in activities. Minimal verbalizations.   Difficult to assess due to: Impaired communication    Balance  Balance Sitting balance-[name]: Poor Standing balance-[name]: Zero  End of Session PT - End of Session Equipment Utilized During Treatment: Gait belt Activity Tolerance: Patient limited by fatigue Patient left: in chair;with call bell/phone within reach;with chair alarm set;with nursing/sitter in room Nurse Communication: Mobility status   GP     Jacqualyn Posey 06/19/2013, 3:11 PM 06/19/2013 Jacqualyn Posey PTA [phone] pager [phone] office

## 2013-06-19 NOTE — Progress Notes (Signed)
Patient ID: [name], female   DOB: [birth year], 80 y.o.   MRN: [id number]   This is an acute visit.  Level of care skilled.  Facility [facility].  Chief complaint-acute visit status post hospitalization for hemorrhagic CVA.  History of present illness.  Patient is an 80 year old female with a history of hypertension and arthritis presented to the ER with inability to speak.  Apparently she been symptomatic for 2 days with increased confusion and difficulty speaking-no previous history of stroke or TIA-she had not been on antiplatelet therapy.  CT of the head did show a left parenchymal hemorrhage adjacent to the Falx--the hemorrhage extended into the left and right ventricles-this was thought to be hemorrhagic-she was admitted to the neuro intensive care nit and showed significant improvement overnight with increased ability to follow commands and some improvement in her aphasia as well as movement of her extremities.  Speech therapy evaluated her and cleared her swallowing for a regular diet with thin liquids.  She did have some urinary retention a Foley catheter was placed-prior to discharge the catheter was removed she was placed on Flomax.  However this evening it appears she has not voided since her discharge from the hospital this morning and she is having some suprapubic tenderness and distention I suspect this will have to be reinserted.  Apparently patient's blood pressure was brought under better control she is on lisinopril 5 mg a day.  Family inpatient rehabilitation was considered but was not improved by her insurance carrier-and thus she has been admitted to a skilled nursing facility for continued speech physical and occupational therapy.  Previous medical history. Recent history hemorrhagic CVA as noted above  Hypertension-.  Arthritis.  Melanoma cancer of the nose.  Previous surgical history.  History of abdominal hysterectomy 1982.  Cesarean section  x4.  Appendectomy with one C-section.  In tonsillectomy at age 37.  Also history of cataract extraction in March of 2013.  She did have an intraocular lens placement.  Social history.  Limited since patient is a phasic apparently there is some history of alcohol use no history of tobacco use.  Medications.  Tylenol 500 mg every 6 hours when necessary.  Fosamax 35 mg q. weekly.  Temovate 1 application twice a day as needed.  Multivitamin.  Lisinopril 5 mg daily.  Methotrexate 2.5 mg take 4 tablets by mouth once a week.  Senna.S. one tablet twice a day.  Flomax 0.5 mg daily.  Kenalog cream 1" twice a day as needed.  Hospital studies.   06/13/2013.  CT of the head-showed left frontal parenchymal hemorrhage adjacent to the Falx This was thought to be hemorrhagic infarction.  06/14/2013.  MRI-MRA brain-.  Showed a left frontal lobe infarction with a hematoma measuring approximately 4.7x1.7 cm-surrounding edema intraventricular penetration with the amount of intraventricular bleed being the same as apparently on the CT scan no hydrocephalus.  There is also hemosiderin deposition along the surface of the brain indicating a previous subarachnoid hemorrhage.  MRA did not show any major vessel occlusion or correctable proximal stenosis-no evidence of aneurysm or high flow vascular malformation area  06/14/2013.  2-D echo that showed ejection fraction 5560% with normal wall motion-no cardiac source of emboli identified.  Carotid Dopplers-bilateral-1-39% ICA stenosis-vertebral artery flow is antegrade  Review of systems-essentially unattainable secondary to a phasic status nursing staff has not noted any complaints of pain shortness of breath or discomfort.  Physical exam.  Temperature is 98.3 pulse of 98 respirations 20 blood pressure  137/77 O2 saturation 95% on room air.  In general this  Appears to be a pleasant elderly female in no distress resting comfortably  in bed she is a phasic.  Her skin is warm and dry there appears to be somewhat of a rash on her back may be more of a heat-related rash secondary to lying in bed.  Eyes pupils appear equal round reactive to light extraocular movements intact visual acuity appears grossly intact.  Oropharynx is clear mucous membranes moist.  Chest is clear to auscultation no labored breathing.  Heart is regular rate and rhythm without murmur gallop or rub.  She does not really have any significant lower extremity edema pedal pulses are intact.   abdomen is soft. She be somewhat tender more in the lower abdominal suprapubic area bowel sounds are active abdomen is soft.--She has a well-healed lower abdominal surgical scar  GU she does appear to have some suprapubic distention and discomfort when the area is palpated.  Muscle skeletal she is able to move all her extremities x4 I do not really note any significant strength deficits grip strength appears to be adequate bilateral upper extremities she appears to have decent range of motion and her shoulders.  Neurologic as stated above she is a phasic but appears to have regained a significant amount of strength in her extremities-cranial nerves appear to be grossly intact she does have some history of slight right lower facial weakness but this appears to be fairly minimal she is a phasic.  Psych again cannot really be tested secondary to patient's aphasic status she did follow simple verbal commands with no difficulty.  Labs.  Jun 14 2013.  Hemoglobin A1c 5.7  Cholesterol 193 triglycerides 87 HDL 83 LDL 93.  06/13/2013.  WBC 7.7 hemoglobin 12.3 platelets 199.  Sodium 141 potassium 3.9 BUN 16 creatinine 0.91 liver function tests within normal limits of note albumin was 3.9.  Assessment and plan.  #1-history of hemorrhagic CVA she appears to have made some  recovery from this although she is a phasic she will need certainly PT OT and speech  therapy.--.  #2-hypertension-this appears under decent control  on lisinopril again will have to get more readings for followup  #3-history of arthritis she is on methotrexate she does not appear to be uncomfortable pain-wise she is receiving Tylenol as needed for this.   #4-history of urinary retention-I suspect she is retaining urine here on exam Will order an in and out cath if greater than 300 cc retained leave him Foley catheter-also if she has more than 800 cc to clamp catheter for about 30 minutes to let the bladder rest before unclamping.  She is on Flomax may need urology followup.  #5-history of back rash-she does have topical treatments ordered apparently this is not a new situation.  #6-history of suspected osteoporosis she is on Fosamax q. weekly.  Number 7 constipation as apparently she had a bowel movement earlier today she continues on Senokot this will have to be monitored as well.  Of note we'll update CBC a BMP tomorrow for updated values-also will need follow up with neurology apparently this has been scheduled for 2 months.  [id number]--of note greater than 35 minutes when assessing patient-reviewing her hospital records-and coordinating in formulating a plan of care-of note greater than 50% of time spent coordinating plan of care    .  Previous surgical

## 2013-06-19 NOTE — Progress Notes (Signed)
Notified Philip Aspen (Daughter [place]) [phone] that her mother would be transferring to [facility] in [place] today via ambulance service arranged by Education officer, museum. Daughter agreed and thanked me for notification.

## 2013-06-19 NOTE — Discharge Summary (Addendum)
Stroke Discharge Summary  Patient ID: [name]   MRN: [id number]      DOB: 1929-08-30  Date of Admission: 06/13/2013 Date of Discharge: 06/19/2013  Attending Physician:  [name] Cloud, MD, Stroke MD  Consulting Physician(s):     rehabilitation medicine Dr [name] Patient's PCP:  [name] Grills, MD  Discharge Diagnoses:  Principal Problem:   ICH (intracerebral hemorrhage) Active Problems:   Hemorrhagic stroke   Unspecified essential hypertension  BMI: Body mass index is 28.07 kg/(m^2).  Past Medical History  Diagnosis Date  . Hypertension   . Arthritis   . Cancer     melanoma of nose   Past Surgical History  Procedure Laterality Date  . Abdominal hysterectomy      1982  . Cesarean section      x4  . Tonsillectomy      age 35  . Appendectomy      with 1 c-section  . Cataract extraction w/phaco  07/31/2011    Procedure: CATARACT EXTRACTION PHACO AND INTRAOCULAR LENS PLACEMENT (IOC);  Surgeon: [name] Branch, MD;  Location: [facility];  Service: Ophthalmology;  Laterality: Right;  CDE: 11.31  . Cataract extraction w/phaco  08/14/2011    Procedure: CATARACT EXTRACTION PHACO AND INTRAOCULAR LENS PLACEMENT (IOC);  Surgeon: [name] Branch, MD;  Location: [facility];  Service: Ophthalmology;  Laterality: Left;  CDE: 10.25      Medication List    STOP taking these medications       naproxen 250 MG tablet  Commonly known as:  NAPROSYN      TAKE these medications       acetaminophen 500 MG tablet  Commonly known as:  TYLENOL  Take 500 mg by mouth every 6 (six) hours as needed. For pain     alendronate 35 MG tablet  Commonly known as:  FOSAMAX  Take 35 mg by mouth every 7 (seven) days. Takes on Sundays/Mondays     clobetasol cream 0.05 %  Commonly known as:  TEMOVATE  Apply 1 application topically 2 (two) times daily as needed. For rash     FIBER SELECT GUMMIES Chew  Chew 1 tablet by mouth daily.     flintstones complete 60 MG chewable tablet  Chew 2 tablets by  mouth daily.     lisinopril 5 MG tablet  Commonly known as:  PRINIVIL,ZESTRIL  Take 5 mg by mouth at bedtime.     methotrexate 2.5 MG tablet  Commonly known as:  RHEUMATREX  Take 4 tablets by mouth once a week.     senna-docusate 8.6-50 MG per tablet  Commonly known as:  Senokot-S  Take 1 tablet by mouth 2 (two) times daily.     tamsulosin 0.4 MG Caps capsule  Commonly known as:  FLOMAX  Take 1 capsule (0.4 mg total) by mouth daily.     triamcinolone ointment 0.1 %  Commonly known as:  KENALOG  Apply 1 application topically 2 (two) times daily as needed. For rash        LABORATORY STUDIES CBC    Component Value Date/Time   WBC 7.7 06/13/2013 1413   RBC 3.66* 06/13/2013 1413   HGB 12.3 06/13/2013 1413   HCT 37.2 06/13/2013 1413   PLT 199 06/13/2013 1413   MCV 101.6* 06/13/2013 1413   MCH 33.6 06/13/2013 1413   MCHC 33.1 06/13/2013 1413   RDW 15.3 06/13/2013 1413   LYMPHSABS 0.7 06/13/2013 1413   MONOABS 0.7 06/13/2013 1413   EOSABS  0.1 06/13/2013 1413   BASOSABS 0.0 06/13/2013 1413   CMP    Component Value Date/Time   NA 141 06/13/2013 1413   K 3.9 06/13/2013 1413   CL 104 06/13/2013 1413   CO2 25 06/13/2013 1413   GLUCOSE 95 06/13/2013 1413   BUN 16 06/13/2013 1413   CREATININE 0.91 06/13/2013 1413   CALCIUM 9.0 06/13/2013 1413   PROT 7.2 06/13/2013 1413   ALBUMIN 3.9 06/13/2013 1413   AST 20 06/13/2013 1413   ALT 15 06/13/2013 1413   ALKPHOS 41 06/13/2013 1413   BILITOT 0.6 06/13/2013 1413   GFRNONAA 58* 06/13/2013 1413   GFRAA 67* 06/13/2013 1413   COAGS Lab Results  Component Value Date   INR 0.96 06/13/2013   INR 1.1 12/23/2008   INR 1.0 12/22/2008   Lipid Panel    Component Value Date/Time   CHOL 193 06/14/2013 1040   TRIG 87 06/14/2013 1040   HDL 83 06/14/2013 1040   CHOLHDL 2.3 06/14/2013 1040   VLDL 17 06/14/2013 1040   LDLCALC 93 06/14/2013 1040   HgbA1C  Lab Results  Component Value Date   HGBA1C 5.7* 06/14/2013   Cardiac Panel (last 3 results) No results found for this basename: CKTOTAL, CKMB,  TROPONINI, RELINDX,  in the last 72 hours Urinalysis    Component Value Date/Time   COLORURINE YELLOW 06/13/2013 2114   APPEARANCEUR CLOUDY* 06/13/2013 2114   LABSPEC 1.017 06/13/2013 2114   PHURINE 7.5 06/13/2013 2114   [place] 06/13/2013 2114   HGBUR NEGATIVE 06/13/2013 2114   BILIRUBINUR NEGATIVE 06/13/2013 2114   KETONESUR 40* 06/13/2013 2114   PROTEINUR NEGATIVE 06/13/2013 2114   UROBILINOGEN 0.2 06/13/2013 2114   NITRITE NEGATIVE 06/13/2013 2114   LEUKOCYTESUR NEGATIVE 06/13/2013 2114   Urine Drug Screen  No results found for this basename: labopia, cocainscrnur, labbenz, amphetmu, thcu, labbarb    Alcohol Level No results found for this basename: eth     SIGNIFICANT DIAGNOSTIC STUDIES  CT head without contrast 06/13/2013 Left frontal parenchymal hemorrhage adjacent to the falx. This is  favored to represent hemorrhagic infarction however an underlying  neoplasm is difficult to exclude based on the edema and mass effect  extending into the genu of the corpus callosum.    MRI / MRA brain 06/14/2013  Left frontal lobe infarction with an intraparenchymal hematoma  measuring approximately 4.7 x 1.7 cm, not significantly changed  since the previous CT. Surrounding edema. Intraventricular  penetration with the amount of intraventricular blood being the  same. No hydrocephalus.  Chronic ischemic changes elsewhere throughout the brain, many  associated with hemosiderin deposition indicating previous  hemorrhagic infarctions. There is also hemosiderin deposition along  with surface of the brain indicating previous subarachnoid  hemorrhage.  MRA does not show major vessel occlusion or correctable proximal  stenosis. No evidence of aneurysm or high flow vascular  malformation.  2-D echo 06/14/2013 Ejection fraction 55-60% with normal wall motion. No cardiac source of emboli identified    Carotid Dopplers 06/14/2013 Bilateral: 1-39% ICA stenosis. Vertebral artery flow is  antegrade  History of Present Illness   [name] is an 80 y.o. female with a history of hypertension and arthritis who presented to the emergency room at [facility] 06/13/2013 with inability to speak. Time of onset was unclear. Patient apparently had been symptomatic for 2 days prior to admission with speech difficulty and apparent confusion. Had no previous history of stroke nor TIA. She had not been on antiplatelet  therapy. CT scan of the head showed left parenchymal hemorrhage adjacent to the falx. There was an extension of hemorrhage into left and right lateral ventricles. This was presumed hemorrhagic infarction. However, mass lesion could not be ruled out. Her NIH stroke score was 8. Patient was transferred to [facility] for further management.    Hospital Course  The patient was admitted to the neurointensive care unit. She showed significant improvement overnight with increased ability to follow commands, some improvement in her aphasia, and increased movement of her extremities. Arrangements were made to transfer the patient to a regular floor. The speech therapist evaluated the patient and cleared her swallowing for a regular diet with thin liquids. She did have some urinary retention and a Foley catheter was placed. The Foley catheter was removed prior to discharge and she was placed on Flomax to assist with the bladder emptying. If she is unable to void she may need the Foley catheter replaced. The patient's hypertension was brought under control. She was evaluated by the physical and occupational therapists. Inpatient rehabilitation was considered however this was not approved by her insurance carrier. It was decided that she should be admitted to a skilled nursing facility. Continued speech therapy, physical therapy, and occupational therapy were recommended. The patient would require 24-hour per day supervision. The patient remains aphasic with generalized weakness.    Patient  with vascular risk factors of:   Hypertension  Alcohol history   Patient also with:  Urinary retention  Arthritis  History of melanoma  Discharge Exam  Blood pressure 123/49, pulse 69, temperature 98 F (36.7 C), temperature source Oral, resp. rate 18, height 5\' 5"  (1.651 m), weight 76.5 kg (168 lb 10.4 oz), SpO2 99.00%.  Physical Exam  HEENT- Normocephalic, no lesions, without obvious abnormality. Normal external eye and conjunctiva. Normal TM's bilaterally. Normal auditory canals and external ears. Normal external nose, mucus membranes and septum. Normal pharynx.  Neck supple with no masses, nodes, nodules or enlargement.  Cardiovascular - regular rate and rhythm, S1, S2 normal, no murmur, click, rub or gallop  Lungs - chest clear, no wheezing, rales, normal symmetric air entry, Heart exam - S1, S2 normal, no murmur, no gallop, rate regular  Abdomen - soft, non-tender; bowel sounds normal; no masses, no organomegaly  Extremities - no joint deformities, effusion, or inflammation, no edema and no skin discoloration  Neurologic Examination:  Mental Status:  Alert, able to state few words at a time. Speech not fluent.  Cranial Nerves:  II-Visual fields were normal.  III/IV/VI-Pupils were equal and reacted. Extraocular movements were full and conjugate.  V/VII-no facial numbness; slight right lower facial weakness facial weakness.  VIII-normal.  X-patient was unable to speak, including no formation.  XII-midline tongue extension  Motor: 4+/5 b/l UE, 4/5 b/l LE  Sensory: Normal throughout.  Deep Tendon Reflexes: 1+ and symmetric.  Plantars: Flexor bilaterally  Cerebellar: Normal finger-to-nose testing.  Carotid auscultation: Normal  Discharge Diet   General thin liquids  Discharge Plan    Disposition:  Admit to skilled nursing facility  No anticoagulants and no antiplatelet therapy for secondary stroke prevention.  Ongoing risk factor control by Primary Care  Physician. Risk factor recommendations:  Hypertension target range 130-140/70-80   Follow-up [name] Grills, MD in 1 month.  Follow-up with Dr. Antony Contras, Stroke Clinic in 2 months.  Greater than 30 minutes were spent preparing discharge.  Signed [name] Bussing PA-C [facility] Pager [phone] 06/19/2013, 1:46 PM  I have personally examined  this patient, reviewed pertinent data and developed the plan of care. I agree with above.

## 2013-06-25 ENCOUNTER — Ambulatory Visit (HOSPITAL_COMMUNITY): Payer: Medicare HMO | Attending: Internal Medicine

## 2013-06-25 ENCOUNTER — Non-Acute Institutional Stay (SKILLED_NURSING_FACILITY): Payer: Medicare HMO | Admitting: Internal Medicine

## 2013-06-25 DIAGNOSIS — I10 Essential (primary) hypertension: Secondary | ICD-10-CM

## 2013-06-25 DIAGNOSIS — M25579 Pain in unspecified ankle and joints of unspecified foot: Secondary | ICD-10-CM

## 2013-06-25 DIAGNOSIS — R339 Retention of urine, unspecified: Secondary | ICD-10-CM

## 2013-06-25 DIAGNOSIS — I619 Nontraumatic intracerebral hemorrhage, unspecified: Secondary | ICD-10-CM

## 2013-06-25 DIAGNOSIS — M79609 Pain in unspecified limb: Secondary | ICD-10-CM | POA: Insufficient documentation

## 2013-06-25 DIAGNOSIS — M129 Arthropathy, unspecified: Secondary | ICD-10-CM

## 2013-06-25 NOTE — Progress Notes (Signed)
Patient ID: [name], female   DOB: September 04, 1929, 80 y.o.   MRN: [id number] Facility; [facility] Chief complaint; admission to SNF post stay at home health 2/6 through 2/12 History; as is a patient who presented to the hospital with inability to speak of. The time of onset was not clear. She had not had any prior history of stroke or other is her vascular problems. CT scan of the head showed a left parenchymal hemorrhage.  There was extension of the hemorrhage into the left and right lateral ventricles appear the patient was seen by speech therapy and she has a regular Dollard diet with thin liquids although this is already been downgraded here. She also developed urinary retention a Foley catheter was removed and then and she was placed on Flomax however she is already been in urinary retention here and required catheterization for 800 cc of residual urine. Furthermore nursing reports that the urine looked to be grossly infected.  The patient has a history of hypertension. Presumably this was a hypertensive intracerebral hemorrhage. MRA did not show any significant vascular abnormalities.  Past Medical History  Diagnosis Date  . Hypertension   . Arthritis   . Cancer     melanoma of nose   Past Surgical History  Procedure Laterality Date  . Abdominal hysterectomy      1982  . Cesarean section      x4  . Tonsillectomy      age 89  . Appendectomy      with 1 c-section  . Cataract extraction w/phaco  07/31/2011    Procedure: CATARACT EXTRACTION PHACO AND INTRAOCULAR LENS PLACEMENT (IOC);  Surgeon: Tonny Branch, MD;  Location: [facility];  Service: Ophthalmology;  Laterality: Right;  CDE: 11.31  . Cataract extraction w/phaco  08/14/2011    Procedure: CATARACT EXTRACTION PHACO AND INTRAOCULAR LENS PLACEMENT (IOC);  Surgeon: Tonny Branch, MD;  Location: [facility];  Service: Ophthalmology;  Laterality: Left;  CDE: 10.25   Current Facility-Administered Medications on File Prior to Visit  Medication Dose Route  Frequency Provider Last Rate Last Dose  . neomycin-polymyxin-dexameth (MAXITROL) 0.1 % ophth ointment    PRN Tonny Branch, MD   1 application at 56/21/30 0909   Current Outpatient Prescriptions on File Prior to Visit  Medication Sig Dispense Refill  . acetaminophen (TYLENOL) 500 MG tablet Take 500 mg by mouth every 6 (six) hours as needed. For pain      . alendronate (FOSAMAX) 35 MG tablet Take 35 mg by mouth every 7 (seven) days. Takes on Sundays/Mondays      . clobetasol cream (TEMOVATE) 8.65 % Apply 1 application topically 2 (two) times daily as needed. For rash      . FIBER SELECT GUMMIES CHEW Chew 1 tablet by mouth daily.      . flintstones complete (FLINTSTONES) 60 MG chewable tablet Chew 2 tablets by mouth daily.      Marland Kitchen lisinopril (PRINIVIL,ZESTRIL) 5 MG tablet Take 5 mg by mouth at bedtime.       . methotrexate (RHEUMATREX) 2.5 MG tablet Take 4 tablets by mouth once a week.      . senna-docusate (SENOKOT-S) 8.6-50 MG per tablet Take 1 tablet by mouth 2 (two) times daily.  60 tablet  1  . tamsulosin (FLOMAX) 0.4 MG CAPS capsule Take 1 capsule (0.4 mg total) by mouth daily.  30 capsule  1  . triamcinolone ointment (KENALOG) 0.1 % Apply 1 application topically 2 (two) times daily as needed. For  rash       Social History: No current information on her living arrangements or functional status prior to this  reports that she has never smoked. She has quit using smokeless tobacco. She reports that she drinks alcohol. She reports that she does not use illicit drugs.  family history is negative for Anesthesia problems, Hypotension, Malignant hyperthermia, and Pseudochol deficiency.  Review of systems; this is not possible from the patient however staff report she is drinking well but not eating well. As mentioned the Foley catheter had to be replaced and over 800 cc was obtained. Possible urinary tract infection as nursing describes her urine is being grossly purulent.  Physical  examination Gen. patient is not in any distress. Vitals O2 sat is 97% on room air pulse 97 respirations 18 and unlabored Respiratory shallow but otherwise clear entry bilaterally Cardiac heart sounds are normal there is no murmurs no bruits JVP is not elevated.  Abdomen; somewhat distended however bowel sounds are positive there is no liver spleen or masses Neurologic; probable right homonymous hemianopsia. She has use of the right hand I cannot get her to spontaneously move the right leg. She is able to respond to some questions clearly answers aren't always correct Musculoskeletal; she has some pain across her metatarsal heads especially on the right. There is some edema in her forefoot on the right  Impression/plan #1 left frontal intraparenchymal hemorrhage probably hypertensive. She has some recovery in her speech and her right arm. The right leg is very difficult to test although I suspect significant weakness #2 urinary retention now with a Foley catheter in place. The exact etiology of this is not clear. I'm going to put her on empiric antibiotics pending the culture. #3 presumably rheumatoid arthritis given the fact that she is on methotrexate weekly. Her right foot pain is really quite significant I'm going to x-ray the right foot however this could be active rheumatoid #4 osteoporosis on Fosamax; I think the Fosamax should be put on hold until we can be certain about her status #5 hypertension which will need to be closely monitored #6? Fecal impaction

## 2013-06-27 ENCOUNTER — Ambulatory Visit (HOSPITAL_COMMUNITY): Payer: Medicare HMO | Attending: Internal Medicine

## 2013-06-27 DIAGNOSIS — S92919A Unspecified fracture of unspecified toe(s), initial encounter for closed fracture: Secondary | ICD-10-CM | POA: Insufficient documentation

## 2013-06-27 DIAGNOSIS — X58XXXA Exposure to other specified factors, initial encounter: Secondary | ICD-10-CM | POA: Insufficient documentation

## 2013-06-27 DIAGNOSIS — M79609 Pain in unspecified limb: Secondary | ICD-10-CM | POA: Insufficient documentation

## 2013-07-05 ENCOUNTER — Non-Acute Institutional Stay (SKILLED_NURSING_FACILITY): Payer: Medicare HMO | Admitting: Internal Medicine

## 2013-07-05 ENCOUNTER — Encounter: Payer: Self-pay | Admitting: Internal Medicine

## 2013-07-05 DIAGNOSIS — I10 Essential (primary) hypertension: Secondary | ICD-10-CM

## 2013-07-05 DIAGNOSIS — S92912A Unspecified fracture of left toe(s), initial encounter for closed fracture: Secondary | ICD-10-CM

## 2013-07-05 DIAGNOSIS — N898 Other specified noninflammatory disorders of vagina: Secondary | ICD-10-CM

## 2013-07-05 DIAGNOSIS — S92919A Unspecified fracture of unspecified toe(s), initial encounter for closed fracture: Secondary | ICD-10-CM

## 2013-07-05 NOTE — Progress Notes (Signed)
Patient ID: [name], female   DOB: 11-15-1929, 80 y.o.   MRN: [id number]   This is an acute visit.  Level of care skilled.  Facility [facility].  Chief complaint acute visit secondary to vaginal discharge-followup UTI-followup left foot pain.  History of present illness.  Patient is an elderly resident who recently sustained a hemorrhagic CVA with resultant relative aphasia and some right-sided weakness.  She also has a history of urinary retention and has a Foley catheter-actually she just completed a course of Cipro for an Escherichia coli UTI.  No nursing staff has noted a creamy yellow colored discharge from the vaginal area.  Patient is not complaining of any dysuria she is afebrile.  Of note she also was noted to have some left foot pain especially around her fifth toe area x-ray did show a nondisplaced fifth proximal phalangeal fracture-she is receiving Tylenol for pain and apparently this is helping.  In regards to hypertension getting this under adequate control is certainly important with her history-recent blood pressure is 124/62-126/51-102/55-these appear to be stable she is on low-dose lisinopril 5 mg a day.  Family medical social history as been reviewed per admission note on 06/25/2013.  Medications have been reviewed per MAR.  Review of systems this is quite limited since patient is relatively aphasic nursing staff report she is stable with no acute complaints again main issues recently appeared to be urinary tract related.  Physical exam.  Temperature is 98.2 pulse 83 respirations 18 blood pressure 124/62-102/55 in this range recently.  General this is a pleasant elderly female in no distress lying comfortably in bed she is smiling and alert.  Her skin is warm and dry.  Oropharynx clear mucous membranes moist  Chest clear to auscultation no labored breathing.  Heart is regular rate and rhythm without murmur gallop or rub.  Abdomen soft does not appear to  be tender bowel sounds positive.  GU-does have a Foley catheter draining amber colored urine I do note a cream yellow discharge from the vaginal area.  Muscle skeletal saw extremities x4 sets and right sided weakness this appears to be fairly minimal-left foot fifth toe I do not note any visible deformity there is some tenderness to palpation of the area .  Neurologic- relatively aphasic is able to speak some and right-sided facial weakness  It appears some right side homonymous hemianopsia  Labs.  06/25/2013.  Urine culture grew out greater than 100,000 colonies of Escherichia coli sensitive to ciprofloxacin which she has just completed.  2- 13 2015.  WBC 8.8 hemoglobin 12.4 platelets 382.  Sodium 138 potassium 3.9 BUN 19 creatinine 0.88.  Assessment and plan.  #1-vaginal discharge  Will obtain a culture-also will empirically start Flagyl 500 mg twice a day  for 7 days since there are suspicious for bacterial vaginosis-clinically she appears stable again has just completed a course of ciprofloxacin for UTI.  #2 history of hemorrhagic CVA-this appears to be at baseline with initial exam-there is an emphasis on hypertension control and this does appear to be stable she is on lisinopril this again will have to be monitored closely.  #3-history of left fifth toe fracture-again this appears to be stable pain appears to be controlled with Tylenol.  [id number]

## 2013-07-17 ENCOUNTER — Encounter: Payer: Self-pay | Admitting: Internal Medicine

## 2013-07-17 ENCOUNTER — Non-Acute Institutional Stay (SKILLED_NURSING_FACILITY): Payer: Medicare HMO | Admitting: Internal Medicine

## 2013-07-17 DIAGNOSIS — I619 Nontraumatic intracerebral hemorrhage, unspecified: Secondary | ICD-10-CM

## 2013-07-17 NOTE — Progress Notes (Signed)
Patient ID: [name], female   DOB: Oct 07, 1929, 80 y.o.   MRN: [id number]   This is an acute visit.  Level of care skilled.  Facility [facility].  Chief complaint-acute visit secondary to question change in status.  History of present illness.  Patient is a pleasant elderly resident who is here for rehabilitation after sustaining an intracerebral hemorrhage--this hemorrhage was in the left parenchymal area with extension into the left in right lateral ventricles.  He did have some recovery in her speech and in her right arm-and apparently made decent progress in the facility here recently and was moving all her extremities which she continues to do.  Apparently at one point she was actually able to roll herself down the hall in her wheelchair which was significant progress since I initially saw her in the facility-apparently she was speaking quite a bit as well which was an encouraging development.  However nursing staff reports over the last couple days she seems to have regressed--not speaking as much-needing a bit more assistance when receiving ADLs-her vital signs continued to be stable and actually on exam today appears to be fairly baseline with what I saw when I examined her previously-however nursing staff says that she really made good progress here that appears to have regressed the last couple days.  Patient is speaking some today does not complaining of any pain she is following verbal commands well.  She does have a history of UTI I note also a history of urinary retention she does have a Foley catheter and is on Flomax as well.  Family medical social history as been reviewed per admission note on 06/25/2013.  Medications have been reviewed per MAR.  Review of systems I again my limits as patient does not speak much but she is not complaining of pain nursing staff has noted some decline in her function the last 2 days.  Physical exam.  Temperature is 97.3 pulse 80  respirations 20 blood pressure 110/62.  General this is a pleasant elderly female in no distress sitting comfortably in her wheelchair.  Her skin is warm and dry.  Heart is regular rate and rhythm without murmur gallop or.  Chest is clear to auscultation shallow air entry but no labored breathing.  Abdomen is soft nontender positive bowel sounds.  Neurologic -she is able to move all extremities x4 is able to kick her legs out bilaterally grip strength appears to be decent bilaterally-I do not really see any focal changes here neurologically as well.  Neurologic-as stated above her pupils do appear reactive to light--- one or 2 words at a time.  Labs.  The third 2015 she did have a urine culture that grew out 70,000 colonies of Staphylococcus species coagulase-negative she was asymptomatic.  -Vaginal culture of the discharge showed normal vaginal flora.  Marland Kitchen 07/06/2013.  Sodium 141 potassium 3.9 BUN 23 creatinine 0.87  2-13th 2015.  WBC 8.8 hemoglobin 12.4 platelets 382.  Assessment and plan.  -Altered mental status-according to nursing she has regressed the past 2 days after making significant progress-will order repeat lab work including a urine culture as well as CBC CMP and TSH-also monitor her vital signs pulse ox every shift.  Clinically she appears to be stable to somewhat improved from the previous exam however  some nursing staff  feels she has regressed some again will await lab work results and monitor her carefully   [id number]-of note  greater than 25 minutes spent assessing patient discussing her status with  nursing staff-and formulating a plan of care    \.

## 2013-07-18 ENCOUNTER — Non-Acute Institutional Stay (SKILLED_NURSING_FACILITY): Payer: Medicare HMO | Admitting: Internal Medicine

## 2013-07-18 ENCOUNTER — Encounter: Payer: Self-pay | Admitting: Internal Medicine

## 2013-07-18 DIAGNOSIS — I619 Nontraumatic intracerebral hemorrhage, unspecified: Secondary | ICD-10-CM

## 2013-07-18 DIAGNOSIS — R4182 Altered mental status, unspecified: Secondary | ICD-10-CM

## 2013-07-18 DIAGNOSIS — N390 Urinary tract infection, site not specified: Secondary | ICD-10-CM

## 2013-07-18 NOTE — Progress Notes (Signed)
Patient ID: [name], female   DOB: [birth year], 80 y.o.   MRN: [id number]   This is an acute visit.  Level of care skilled.  Facility [facility].  Chief complaint to visit followup change in status with history of hemorrhagic CVA.     History of present illness.   Patient is a pleasant elderly resident who is here for rehabilitation after sustaining an intracerebral hemorrhage--this hemorrhage was in the left parenchymal area with extension into the left in right lateral ventricles.   He did have some recovery in her speech and in her right arm-and apparently made decent progress in the facility here recently and was moving all her extremities which she continues to do.   Apparently at one point she was actually able to roll herself down the hall in her wheelchair which was significant progress since I initially saw her in the facility-apparently she was speaking quite a bit as well which was an encouraging development--per night nurse says that she appears to be more alert than yesterday.    I saw her yesterday secondary to concerns that she was progressing a bit according to some nursing staff needing more help with her ADLs etc. not speaking as much.  Lab work was ordered which is return unremarkable although a urine culture is still pending.  Today she appears to be doing okay actually talking a bit more than yesterday she is bright alert actually visiting with her daughter who has just arrived from the [place] appears to be in good spirits certainly does not appear to beregressing at all from yesterday.  I did speak with her dayside nurse who is quite familiar with her she says she has not really noted any significant change here but thinks she may be developing a UTI-- again a urine culture is pending    She does have a history of UTI I note also a history of urinary retention she does have a Foley catheter and is on Flomax as well.   Family medical social history as  been reviewed per admission note on 06/25/2013.   Medications have been reviewed per MAR.   Review of systems I again my limits as patient does not speak much but she is not complaining of pain nursing staff had noted some decline in her functional status earlier this week but this appears to have stabilized today from what I see and what her nurse telling me today.   Physical exam.  Temperature is 99.2 pulse 87 respirations 20 blood pressure 107/60 O2 saturation 98% on room air   General this is a pleasant elderly female in no distress sitting comfortably in her wheelchair.--is smiling alert talking more than yesterday a few words at a time   Her skin is warm and dry.   Heart is regular rate and rhythm without murmur gallop or.   Chest is clear to auscultation shallow air entry but no labored breathing.   Abdomen is soft nontender positive bowel sounds  Could not really appreciate any suprapubic tenderness althoughdifficult to fully ascertain from patient's limited reactions.   Neurologic -she is able to move all extremities x4 is able to kick her legs out bilaterally grip strength appears to be decent bilaterally-I do not really see any focal changes here neurologically as well.--This is unchanged from yesterday although she appears to be talking more--   Neurologic-as stated above her pupils do appear reactive to light---.   Labs.  07/17/2013.  WBC 10.4 hemoglobin 12.5 platelets 282.  Sodium 142 potassium 4.7 BUN 19 creatinine 0.87.  Liver function tests within normal limits except albumin of 3.4.  Urinalysis shows positive nitrite large leukocyte WBCs too numerous to count bacteria many.     3- third 2015 she did have a urine culture that grew out 70,000 colonies of Staphylococcus species coagulase-negative she was asymptomatic.   -Vaginal culture of the discharge showed normal vaginal flora.   Marland Kitchen 07/06/2013.   Sodium 141 potassium 3.9 BUN 23 creatinine  0.87   2-13th 2015.   WBC 8.8 hemoglobin 12.4 platelets 382.   Assessment and plan.    #1-Altered mentalL status with history of hemorrhagic CVA-she actually appears to be doing better today than yesterday according to what nursing tells me neurologically she remains at her baseline.  She does have a low-grade temperature and urinalysis is suspicious for UTI I know the last UTI of greater than 100,000 colonies was Escherichia coli sensitive to Cipro will start her empirically on this and await final culture results continue to monitor with vitall signs pulse ox every shift  [id number]       \.                                                 y

## 2013-07-22 ENCOUNTER — Encounter: Payer: Self-pay | Admitting: *Deleted

## 2013-07-25 ENCOUNTER — Observation Stay (HOSPITAL_COMMUNITY)
Admission: EM | Admit: 2013-07-25 | Discharge: 2013-07-26 | Disposition: A | Discharge status: Hospice | Payer: Medicare HMO | Attending: Family Medicine | Admitting: Family Medicine

## 2013-07-25 ENCOUNTER — Encounter (HOSPITAL_COMMUNITY): Payer: Self-pay | Admitting: Emergency Medicine

## 2013-07-25 ENCOUNTER — Emergency Department (HOSPITAL_COMMUNITY): Payer: Medicare HMO

## 2013-07-25 DIAGNOSIS — R339 Retention of urine, unspecified: Secondary | ICD-10-CM | POA: Diagnosis present

## 2013-07-25 DIAGNOSIS — R29810 Facial weakness: Secondary | ICD-10-CM | POA: Insufficient documentation

## 2013-07-25 DIAGNOSIS — I69959 Hemiplegia and hemiparesis following unspecified cerebrovascular disease affecting unspecified side: Secondary | ICD-10-CM | POA: Insufficient documentation

## 2013-07-25 DIAGNOSIS — M129 Arthropathy, unspecified: Secondary | ICD-10-CM | POA: Insufficient documentation

## 2013-07-25 DIAGNOSIS — I69920 Aphasia following unspecified cerebrovascular disease: Secondary | ICD-10-CM | POA: Insufficient documentation

## 2013-07-25 DIAGNOSIS — I10 Essential (primary) hypertension: Secondary | ICD-10-CM | POA: Insufficient documentation

## 2013-07-25 DIAGNOSIS — I629 Nontraumatic intracranial hemorrhage, unspecified: Principal | ICD-10-CM | POA: Diagnosis present

## 2013-07-25 DIAGNOSIS — A4902 Methicillin resistant Staphylococcus aureus infection, unspecified site: Secondary | ICD-10-CM | POA: Insufficient documentation

## 2013-07-25 DIAGNOSIS — Z85820 Personal history of malignant melanoma of skin: Secondary | ICD-10-CM | POA: Insufficient documentation

## 2013-07-25 DIAGNOSIS — I619 Nontraumatic intracerebral hemorrhage, unspecified: Secondary | ICD-10-CM | POA: Diagnosis present

## 2013-07-25 HISTORY — DX: Cerebral infarction, unspecified: I63.9

## 2013-07-25 LAB — CBC WITH DIFFERENTIAL/PLATELET
BASOS ABS: 0 10*3/uL (ref 0.0–0.1)
Basophils Relative: 0 % (ref 0–1)
Eosinophils Absolute: 0.1 10*3/uL (ref 0.0–0.7)
Eosinophils Relative: 1 % (ref 0–5)
HEMATOCRIT: 35.5 % — AB (ref 36.0–46.0)
Hemoglobin: 11.5 g/dL — ABNORMAL LOW (ref 12.0–15.0)
LYMPHS PCT: 10 % — AB (ref 12–46)
Lymphs Abs: 0.9 10*3/uL (ref 0.7–4.0)
MCH: 32.3 pg (ref 26.0–34.0)
MCHC: 32.4 g/dL (ref 30.0–36.0)
MCV: 99.7 fL (ref 78.0–100.0)
Monocytes Absolute: 0.8 10*3/uL (ref 0.1–1.0)
Monocytes Relative: 8 % (ref 3–12)
NEUTROS ABS: 7.8 10*3/uL — AB (ref 1.7–7.7)
Neutrophils Relative %: 81 % — ABNORMAL HIGH (ref 43–77)
Platelets: 303 10*3/uL (ref 150–400)
RBC: 3.56 MIL/uL — ABNORMAL LOW (ref 3.87–5.11)
RDW: 14.7 % (ref 11.5–15.5)
WBC: 9.6 10*3/uL (ref 4.0–10.5)

## 2013-07-25 LAB — BASIC METABOLIC PANEL
BUN: 12 mg/dL (ref 6–23)
CHLORIDE: 100 meq/L (ref 96–112)
CO2: 27 mEq/L (ref 19–32)
Calcium: 9.2 mg/dL (ref 8.4–10.5)
Creatinine, Ser: 0.82 mg/dL (ref 0.50–1.10)
GFR calc Af Amer: 76 mL/min — ABNORMAL LOW (ref >90)
GFR calc non Af Amer: 65 mL/min — ABNORMAL LOW (ref >90)
Glucose, Bld: 115 mg/dL — ABNORMAL HIGH (ref 70–99)
POTASSIUM: 4.3 meq/L (ref 3.7–5.3)
SODIUM: 138 meq/L (ref 137–147)

## 2013-07-25 LAB — URINALYSIS, ROUTINE W REFLEX MICROSCOPIC
Bilirubin Urine: NEGATIVE
Glucose, UA: NEGATIVE mg/dL
Ketones, ur: NEGATIVE mg/dL
NITRITE: NEGATIVE
SPECIFIC GRAVITY, URINE: 1.02 (ref 1.005–1.030)
Urobilinogen, UA: 1 mg/dL (ref 0.0–1.0)
pH: 7 (ref 5.0–8.0)

## 2013-07-25 LAB — PROTIME-INR
INR: 0.9 (ref 0.00–1.49)
PROTHROMBIN TIME: 12 s (ref 11.6–15.2)

## 2013-07-25 LAB — URINE MICROSCOPIC-ADD ON

## 2013-07-25 MED ORDER — ACETAMINOPHEN 325 MG PO TABS: 650.0000 mg | ORAL_TABLET | Freq: Four times a day (QID) | ORAL | Status: DC | PRN

## 2013-07-25 MED ORDER — ONDANSETRON HCL 4 MG/2ML IJ SOLN: 4.0000 mg | Freq: Four times a day (QID) | INTRAMUSCULAR | Status: DC | PRN

## 2013-07-25 MED ORDER — ACETAMINOPHEN 650 MG RE SUPP
650.0000 mg | Freq: Four times a day (QID) | RECTAL | Status: DC | PRN
  Administered 2013-07-26: 650 mg via RECTAL
  Filled 2013-07-25: qty 1

## 2013-07-25 MED ORDER — ONDANSETRON HCL 4 MG PO TABS: 4.0000 mg | ORAL_TABLET | Freq: Four times a day (QID) | ORAL | Status: DC | PRN

## 2013-07-25 MED ORDER — MORPHINE SULFATE 2 MG/ML IJ SOLN
2.0000 mg | Freq: Once | INTRAMUSCULAR | Status: AC
  Administered 2013-07-25: 2 mg via INTRAVENOUS
  Filled 2013-07-25: qty 1

## 2013-07-25 MED ORDER — MORPHINE SULFATE 2 MG/ML IJ SOLN
1.0000 mg | INTRAMUSCULAR | Status: DC | PRN
  Administered 2013-07-25 (×2): 1 mg via INTRAVENOUS
  Filled 2013-07-25 (×2): qty 1

## 2013-07-25 NOTE — ED Notes (Signed)
Family remains at bedside. Patient resting, appears comfortable. Cheyne stokes breathing pattern continues.

## 2013-07-25 NOTE — Progress Notes (Signed)
Nutrition Brief Note  Chart reviewed. Pt now transitioning to comfort care.  No further nutrition interventions warranted at this time.  Please re-consult as needed.   Ottis Vacha A. Houda Brau, RD, LDN Pager: [phone]  

## 2013-07-25 NOTE — ED Notes (Signed)
Karen Black, NP at bedside 

## 2013-07-25 NOTE — H&P (Signed)
   I agree with the History/assesment & plan per Midlevel provider as per above Further details as follows:-  Filed Vitals:   07/25/13 1315  BP: 141/69  Pulse: 86  Temp: 98.7 F (37.1 C)  Resp: 18  shallow respirations, sleeping, pinpoint pupils, no rhonchi/rales, reflexes decreased throughout  Expect very poor prognosis.   If survived beyong the next 24-36 hours, will d/c to free standing SNF.  >20 min discussion with daughter at bedside face to face   Verneita Griffes, MD [facility] [phone]

## 2013-07-25 NOTE — ED Notes (Signed)
Cheyne-stokes breathing noted during assessment. Patient hard to arouse, but arouses to painful stimuli. Garbled speech, but able to answer questions appropriately at times. MD made aware of breathing pattern.

## 2013-07-25 NOTE — ED Notes (Signed)
Report given to floor RN

## 2013-07-25 NOTE — ED Provider Notes (Addendum)
CSN: [id number]     Arrival date & time 07/25/13  7824 History   This chart was scribed for [name], * by Era Bumpers, ED scribe. This patient was seen in room APA02/APA02 and the patient's care was started at 0853.  Chief Complaint  Patient presents with  . Extremity Weakness   Level 5 Caveat to Pt's Condition  The history is provided by the patient. No language interpreter was used.   HPI Comments: [name] is a 80 y.o. female brought in by ambulance from nursing home, who presents to the Emergency Department for new onset of left sided facial droop, left arm weakness and left leg weakness which nursing home staff noticed this AM. Her baseline before this AM has been aphasia and generalized weakness since she had a hemorrhagic stroke a month and a half ago. Today she is with new onset of left sided weakness. Hx limited to pt's condition.   Past Medical History  Diagnosis Date  . Hypertension   . Arthritis   . Cancer     melanoma of nose  . Stroke     2/15 on left   Past Surgical History  Procedure Laterality Date  . Abdominal hysterectomy      1982  . Cesarean section      x4  . Tonsillectomy      age 55  . Appendectomy      with 1 c-section  . Cataract extraction w/phaco  07/31/2011    Procedure: CATARACT EXTRACTION PHACO AND INTRAOCULAR LENS PLACEMENT (IOC);  Surgeon: Tonny Branch, MD;  Location: [facility];  Service: Ophthalmology;  Laterality: Right;  CDE: 11.31  . Cataract extraction w/phaco  08/14/2011    Procedure: CATARACT EXTRACTION PHACO AND INTRAOCULAR LENS PLACEMENT (IOC);  Surgeon: Tonny Branch, MD;  Location: [facility];  Service: Ophthalmology;  Laterality: Left;  CDE: 10.25   Family History  Problem Relation Age of Onset  . Anesthesia problems Neg Hx   . Hypotension Neg Hx   . Malignant hyperthermia Neg Hx   . Pseudochol deficiency Neg Hx    History  Substance Use Topics  . Smoking status: Never Smoker   . Smokeless tobacco: Former Systems developer  .  Alcohol Use: Yes     Comment: socially   OB History   Grav Para Term Preterm Abortions TAB SAB Ect Mult Living   4 4 4       4      Review of Systems  Unable to perform ROS Neurological: Positive for speech difficulty and weakness.    Allergies  Review of patient's allergies indicates no known allergies.  Home Medications   Current Outpatient Rx  Name  Route  Sig  Dispense  Refill  . clobetasol cream (TEMOVATE) 0.05 %   Topical   Apply 1 application topically 2 (two) times daily as needed. For rash         . clobetasol cream (TEMOVATE) 0.05 %   Topical   Apply 1 application topically 2 (two) times daily as needed. For rash         . FIBER SELECT GUMMIES CHEW   Oral   Chew 1 tablet by mouth daily.         . flintstones complete (FLINTSTONES) 60 MG chewable tablet   Oral   Chew 2 tablets by mouth daily.         Marland Kitchen lisinopril (PRINIVIL,ZESTRIL) 5 MG tablet   Oral   Take 5 mg by mouth at  bedtime.          . nitrofurantoin, macrocrystal-monohydrate, (MACROBID) 100 MG capsule   Oral   Take 100 mg by mouth 2 (two) times daily.         . polyethylene glycol (MIRALAX / GLYCOLAX) packet   Oral   Take 17 g by mouth daily. For constipation         . Probiotic Product (PROBIOTIC DAILY PO)   Oral   Take 1 tablet by mouth 2 (two) times daily.         Marland Kitchen senna-docusate (SENOKOT-S) 8.6-50 MG per tablet   Oral   Take 1 tablet by mouth 2 (two) times daily.   60 tablet   1   . tamsulosin (FLOMAX) 0.4 MG CAPS capsule   Oral   Take 1 capsule (0.4 mg total) by mouth daily.   30 capsule   1   . acetaminophen (TYLENOL) 500 MG tablet   Oral   Take 500 mg by mouth every 6 (six) hours as needed. For pain         . methotrexate (RHEUMATREX) 2.5 MG tablet   Oral   Take 10 mg by mouth once a week. Four tablets equal 10 mg.         . promethazine (PHENERGAN) 12.5 MG tablet   Oral   Take 12.5 mg by mouth every 6 (six) hours as needed for nausea or vomiting.           Triage Vitals: BP 115/73  Pulse 87  Temp(Src) 98.1 F (36.7 C) (Rectal)  Resp 17  Ht 5\' 6"  (1.676 m)  Wt 168 lb (76.204 kg)  BMI 27.13 kg/m2  SpO2 96%  Physical Exam  Nursing note and vitals reviewed. Constitutional: She appears well-developed and well-nourished. No distress.  HENT:  Head: Normocephalic and atraumatic.  Eyes: Conjunctivae are normal. Right eye exhibits no discharge. Left eye exhibits no discharge.  Neck: Normal range of motion.  Cardiovascular: Normal rate, regular rhythm and normal heart sounds.   Pulmonary/Chest: Breath sounds normal. No respiratory distress. She has no wheezes. She has no rales.  Musculoskeletal: Normal range of motion. She exhibits no edema.  Neurological: She is alert. A cranial nerve deficit is present. She exhibits abnormal muscle tone. GCS verbal subscore is 4.  Left sided facial droop, garbled speech, left arm and left leg weakness (str 0/5)  Skin: Skin is warm and dry.    ED Course  Procedures (including critical care time) DIAGNOSTIC STUDIES: Oxygen Saturation is 96% on room air, normal by my interpretation.    COORDINATION OF CARE: At 900 AM Discussed treatment plan with patient which includes head CT, blood work, UA and EKG. Patient agrees.   Labs Review Labs Reviewed  CBC WITH DIFFERENTIAL - Abnormal; Notable for the following:    RBC 3.56 (*)    Hemoglobin 11.5 (*)    HCT 35.5 (*)    Neutrophils Relative % 81 (*)    Neutro Abs 7.8 (*)    Lymphocytes Relative 10 (*)    All other components within normal limits  BASIC METABOLIC PANEL - Abnormal; Notable for the following:    Glucose, Bld 115 (*)    GFR calc non Af Amer 65 (*)    GFR calc Af Amer 76 (*)    All other components within normal limits  URINALYSIS, ROUTINE W REFLEX MICROSCOPIC - Abnormal; Notable for the following:    Hgb urine dipstick LARGE (*)    Protein, ur  TRACE (*)    Leukocytes, UA TRACE (*)    All other components within normal limits   URINE MICROSCOPIC-ADD ON - Abnormal; Notable for the following:    Bacteria, UA MANY (*)    All other components within normal limits  URINE CULTURE  PROTIME-INR   Imaging Review Ct Head Wo Contrast  07/25/2013   CLINICAL DATA:  Right-sided hemi paresis, inability to communicate, history of left-sided frontal hemorrhage, code stroke  EXAM: CT HEAD WITHOUT CONTRAST  TECHNIQUE: Contiguous axial images were obtained from the base of the skull through the vertex without intravenous contrast.  COMPARISON:  MR HEAD WO/W CM dated 06/14/2013; CT HEAD W/O CM dated 06/13/2013  FINDINGS: There is been development of a large at least 7.2 x 3.5 cm intraparenchymal hemorrhage within the right frontal and parietal lobes (representative images 23, series 2). There is an additional approximately 2.6 x 2.7 cm intraparenchymal hemorrhage within the medial aspect the right frontal lobe (15). These findings are associated with approximately 6 mm of right to left midline shift and effacement of the majority of the right lateral ventricle. There is no definitive extension of these intraparenchymal hemorrhages into the ventricular system or subarachnoid space.  There has been interval involution of previously noted intraparenchymal hemorrhage within the medial aspect of the left frontal lobe with associated residual encephalomalacia (image 20).  Limited visualization of the paranasal sinuses and mastoid air cells are normal. Regional soft tissues are normal. Post bilateral cataract surgery. No displaced calvarial fracture.  IMPRESSION: Interval development of large hemorrhages within the right frontoparietal (measuring at least 7.2 cm) and medial aspect of the right frontal lobes with associated approximately 6 mm of right-to-left midline shift. When compared to prior examinations which demonstrate additional areas of intraparenchymal hemorrhage, in the absence of anti coagulation, the etiology of these multi focal intraparenchymal  hemorrhages are concerning for underline amyloidosis.  Critical Value/emergent results were called by telephone at the time of interpretation on 07/25/2013 at 9:29 AM to Dr. Joseph Berkshire , who verbally acknowledged these results.   Electronically Signed   By: Sandi Mariscal M.D.   On: 07/25/2013 09:37     EKG Interpretation None      Date: 07/25/2013  Rate: 84  Rhythm: normal sinus rhythm  QRS Axis: normal  Intervals: PR prolonged  ST/T Wave abnormalities: nonspecific T wave changes  Conduction Disutrbances:first-degree A-V block  and inc RBBB  Narrative Interpretation:   Old EKG Reviewed: T changes are new    MDM   Final diagnoses:  Intracranial Hemorrhage   Patient presented for mental status changes, left facial droop, inability to move the left arm. This was discovered upon checking her this morning. She was apparently normal last night. Patient has had a recent hemorrhagic. She was sent to radiology upon arrival to further evaluate. CT scan shows evidence of 2 large hemorrhages on the right side of her brain which explain the neurologic findings.  Patient's daughter is here in the ER. I did explain to her the findings of the CAT scan. I had a lengthy conversation with her daughter. I explained to her that the patient is unlikely to survive the amount of bleeding that we have seen, especially since this is the second lead in one month. I did tell her that we could intubate the patient and sent her to [facility], but that another option would be comfort measures. I indicated that even aggressive interventions were unlikely to save her life or her having  meaningful recovery if she survived.  After discussions with her siblings, daughter agrees that she wants her mother kept comfortable, no intubation, CPR, etc.   The patient will therefore be presented to the hospitalist for admission here for comfort measures.  I personally performed the services described in this documentation,  which was scribed in my presence. The recorded information has been reviewed and is accurate.      [name], MD 07/25/13 1118  [name], MD 07/25/13 [phone]

## 2013-07-25 NOTE — ED Notes (Signed)
Pt here from [facility] for evaluation of stroke symptoms. Had previous stroke 06/13/2013. Foley intact from nursing home. Pt has hx of MRSA

## 2013-07-25 NOTE — Progress Notes (Signed)
Per [facility] nurse patient is MRSA + in urine.

## 2013-07-25 NOTE — ED Notes (Signed)
Daughter at bedside.

## 2013-07-25 NOTE — H&P (Signed)
[facility] History and Physical  [name] RDE:[id number] DOB: 01/21/1930 DOA: 07/25/2013  Referring physician:  PCP: Leonides Grills, MD   Chief Complaint: left facial droop  HPI: [name] is a 80 y.o. female who is a resident of [facility] and has a past medical history that includes hemorrhagic stroke on the left 6 weeks ago presents to the emergency room with the chief complaint of extremity weakness and left facial droop. Information is obtained from the chart, staff and daughter who are at the bedside. Chart review indicates that the staff noted this morning that patient had a left facial droop and inability to move her left arm. She was reportedly normal last night. However chart review does indicate that patient was evaluated 7 days ago as staff reported that she was not speaking as much as she had been and was not as energetic. At that time there was concern for urinary tract infection for which she has a propensity and she was started apparently on Cipro. Next day she appeared to have improved. Daughter noted that she's been here for a week and thought there were "subtle changes in her speech and that her face was slightly drooping". There is no report of pain nausea vomiting. No report of decreased appetite fever chills diarrhea. Evaluation in the emergency department reveals a CT scan that shows evidence of 2 large hemorrhages on the right side of her brain which would explain the neurological findings. The daughter spoke with her siblings and they have agreed for comfort measures only.  My exam she responds to painful stimuli only with a facial grimace. Respirations are irregular. Vital signs are stable. We are asked to admit  Review of Systems:  Unable to completely obtain a full review of systems. Chart review and discussions with staff indicate systems negative except as noted in history of present illness   Past Medical History  Diagnosis Date  . Hypertension    . Arthritis   . Cancer     melanoma of nose   Past Surgical History  Procedure Laterality Date  . Abdominal hysterectomy      1982  . Cesarean section      x4  . Tonsillectomy      age 60  . Appendectomy      with 1 c-section  . Cataract extraction w/phaco  07/31/2011    Procedure: CATARACT EXTRACTION PHACO AND INTRAOCULAR LENS PLACEMENT (IOC);  Surgeon: Tonny Branch, MD;  Location: [facility];  Service: Ophthalmology;  Laterality: Right;  CDE: 11.31  . Cataract extraction w/phaco  08/14/2011    Procedure: CATARACT EXTRACTION PHACO AND INTRAOCULAR LENS PLACEMENT (IOC);  Surgeon: Tonny Branch, MD;  Location: [facility];  Service: Ophthalmology;  Laterality: Left;  CDE: 10.25   Social History:  reports that she has never smoked. She has quit using smokeless tobacco. She reports that she drinks alcohol. She reports that she does not use illicit drugs.  No Known Allergies  Family History  Problem Relation Age of Onset  . Anesthesia problems Neg Hx   . Hypotension Neg Hx   . Malignant hyperthermia Neg Hx   . Pseudochol deficiency Neg Hx      Prior to Admission medications   Medication Sig Start Date End Date Taking? Authorizing Provider  clobetasol cream (TEMOVATE) 6.31 % Apply 1 application topically 2 (two) times daily as needed. For rash   Yes Historical Provider, MD  clobetasol cream (TEMOVATE) 4.97 % Apply 1 application topically 2 (two)  times daily as needed. For rash   Yes Historical Provider, MD  FIBER SELECT GUMMIES CHEW Chew 1 tablet by mouth daily.   Yes Historical Provider, MD  flintstones complete (FLINTSTONES) 60 MG chewable tablet Chew 2 tablets by mouth daily.   Yes Historical Provider, MD  lisinopril (PRINIVIL,ZESTRIL) 5 MG tablet Take 5 mg by mouth at bedtime.    Yes Historical Provider, MD  nitrofurantoin, macrocrystal-monohydrate, (MACROBID) 100 MG capsule Take 100 mg by mouth 2 (two) times daily. 07/22/13 08/01/13 Yes Historical Provider, MD  polyethylene glycol (MIRALAX /  GLYCOLAX) packet Take 17 g by mouth daily. For constipation   Yes Historical Provider, MD  Probiotic Product (PROBIOTIC DAILY PO) Take 1 tablet by mouth 2 (two) times daily. 07/21/13 Aug 08, 2013 Yes Historical Provider, MD  senna-docusate (SENOKOT-S) 8.6-50 MG per tablet Take 1 tablet by mouth 2 (two) times daily. 06/19/13  Yes David L Rinehuls, PA-C  tamsulosin (FLOMAX) 0.4 MG CAPS capsule Take 1 capsule (0.4 mg total) by mouth daily. 06/19/13  Yes David L Rinehuls, PA-C  acetaminophen (TYLENOL) 500 MG tablet Take 500 mg by mouth every 6 (six) hours as needed. For pain    Historical Provider, MD  methotrexate (RHEUMATREX) 2.5 MG tablet Take 10 mg by mouth once a week. Four tablets equal 10 mg. 05/28/13   Historical Provider, MD  promethazine (PHENERGAN) 12.5 MG tablet Take 12.5 mg by mouth every 6 (six) hours as needed for nausea or vomiting.    Historical Provider, MD   Physical Exam: Filed Vitals:   07/25/13 1120  BP: 133/50  Pulse: 79  Temp:   Resp: 16    BP 133/50  Pulse 79  Temp(Src) 98.1 F (36.7 C) (Rectal)  Resp 16  Ht 5\' 6"  (1.676 m)  Wt 76.204 kg (168 lb)  BMI 27.13 kg/m2  SpO2 95%  General:  Well-developed responds to pain only no acute distress Eyes: PERRL, normal lids, irises & conjunctiva ENT: grossly normal hearing, lips & tongue Neck: no LAD, masses or thyromegaly Cardiovascular: RRR, no m/r/g. No LE edema. Telemetry: SR, no arrhythmias  Respiratory: CTA bilaterally, no w/r/r. Normal respiratory effort but air regular Abdomen: soft, ntnd Skin: no rash or induration seen on limited exam Musculoskeletal: No clubbing no cyanosis joints without swelling/erythema Psychiatric: grossly normal mood and affect, speech fluent and appropriate Neurologic: Responds to painful stimuli only with a facial grimace left-sided facial droop. He can grip on right but not necessarily to command. No grip on left no spontaneous movement of the left arm or leg           Labs on Admission:    Basic Metabolic Panel:  Recent Labs Lab 07/25/13 0940  NA 138  K 4.3  CL 100  CO2 27  GLUCOSE 115*  BUN 12  CREATININE 0.82  CALCIUM 9.2   Liver Function Tests: No results found for this basename: AST, ALT, ALKPHOS, BILITOT, PROT, ALBUMIN,  in the last 168 hours No results found for this basename: LIPASE, AMYLASE,  in the last 168 hours No results found for this basename: AMMONIA,  in the last 168 hours CBC:  Recent Labs Lab 07/25/13 0940  WBC 9.6  NEUTROABS 7.8*  HGB 11.5*  HCT 35.5*  MCV 99.7  PLT 303   Cardiac Enzymes: No results found for this basename: CKTOTAL, CKMB, CKMBINDEX, TROPONINI,  in the last 168 hours  BNP (last 3 results) No results found for this basename: PROBNP,  in the last 8760 hours CBG: No  results found for this basename: GLUCAP,  in the last 168 hours  Radiological Exams on Admission: Ct Head Wo Contrast  07/25/2013   CLINICAL DATA:  Right-sided hemi paresis, inability to communicate, history of left-sided frontal hemorrhage, code stroke  EXAM: CT HEAD WITHOUT CONTRAST  TECHNIQUE: Contiguous axial images were obtained from the base of the skull through the vertex without intravenous contrast.  COMPARISON:  MR HEAD WO/W CM dated 06/14/2013; CT HEAD W/O CM dated 06/13/2013  FINDINGS: There is been development of a large at least 7.2 x 3.5 cm intraparenchymal hemorrhage within the right frontal and parietal lobes (representative images 23, series 2). There is an additional approximately 2.6 x 2.7 cm intraparenchymal hemorrhage within the medial aspect the right frontal lobe (15). These findings are associated with approximately 6 mm of right to left midline shift and effacement of the majority of the right lateral ventricle. There is no definitive extension of these intraparenchymal hemorrhages into the ventricular system or subarachnoid space.  There has been interval involution of previously noted intraparenchymal hemorrhage within the medial aspect of the  left frontal lobe with associated residual encephalomalacia (image 20).  Limited visualization of the paranasal sinuses and mastoid air cells are normal. Regional soft tissues are normal. Post bilateral cataract surgery. No displaced calvarial fracture.  IMPRESSION: Interval development of large hemorrhages within the right frontoparietal (measuring at least 7.2 cm) and medial aspect of the right frontal lobes with associated approximately 6 mm of right-to-left midline shift. When compared to prior examinations which demonstrate additional areas of intraparenchymal hemorrhage, in the absence of anti coagulation, the etiology of these multi focal intraparenchymal hemorrhages are concerning for underline amyloidosis.  Critical Value/emergent results were called by telephone at the time of interpretation on 07/25/2013 at 9:29 AM to Dr. Joseph Berkshire , who verbally acknowledged these results.   Electronically Signed   By: Sandi Mariscal M.D.   On: 07/25/2013 09:37    EKG:   Assessment/Plan Principal Problem:     Hemorrhagic stroke: History of same on left. CT today reveals additional bleeding on right. Family discussed options and settled on comfort care only. Will admit to medical floor. Back pain medicine as indicated. I be a candidate for home hospice stabilizes. Active Problems:   Urinary retention: hx of same. Home medications include flomax. Will hold for now given above.      Code Status: DNR Family Communication: daughter at bedside Disposition Plan: poor prognosis, may be candidate for Home Hospice if stabelizes  Time spent: 26 minutes  [facility] Pager [phone]

## 2013-07-26 MED ORDER — SCOPOLAMINE 1 MG/3DAYS TD PT72
1.0000 | MEDICATED_PATCH | TRANSDERMAL | Status: DC
  Administered 2013-07-26: 1.5 mg via TRANSDERMAL
  Filled 2013-07-26: qty 1

## 2013-07-26 MED ORDER — SCOPOLAMINE 1 MG/3DAYS TD PT72: 1.0000 | MEDICATED_PATCH | TRANSDERMAL | Status: AC

## 2013-07-26 MED ORDER — MORPHINE SULFATE 2 MG/ML IJ SOLN: 1.0000 mg | INTRAMUSCULAR | Status: AC | PRN

## 2013-07-26 NOTE — Discharge Summary (Signed)
. Physician Discharge Summary  [name] OQH:[id number] DOB: 05/15/1929 DOA: 07/25/2013  PCP: Leonides Grills, MD  Admit date: 07/25/2013 Discharge date: 07/26/2013  Time spent: 20 minutes  Recommendations for Outpatient Follow-up:  Patient to pursue full hospice and comfort care expect very poor prognosis less than 3-4 days    Discharge Diagnoses:  Principal Problem:   Intracranial bleed Active Problems:   Hemorrhagic stroke   Urinary retention   Discharge Condition: Extremely guarded  Diet recommendation: N.p.o.  Filed Weights   07/25/13 0859 07/25/13 1315 07/26/13 0611  Weight: 76.204 kg (168 lb) 76.6 kg (168 lb 14 oz) 74.6 kg (164 lb 7.4 oz)    History of present illness:  80 year old female recent hemorrhagic stroke February 2015 he presented with left facial droop inability to move and decreased appetite Brought to emergency room from skilled nursing center and CT scan showed 2 large hemorrhages on the right side of her brain. Extensive family discussions occurred and patient was made full comfort care  patient coincidentally found to have MRSA in her urine Patient started on morphine 1-2 mg every 2 when necessary as well as scopolamine during hospital stay. Expect very poor outcome and quick demise within 48-72 hours Discussed extensively with family Will be discharged to [facility] hosp[facility]    Discharge Exam: Filed Vitals:   07/26/13 0611  BP: 128/45  Pulse: 76  Temp: 102.2 F (39 C)  Resp: 20    General: EOMI, NCAT  Cardiovascular: S1-S2 no murmur rub or gallop  Respiratory: Clinically clear  Discharge Instructions  Discharge Orders   Future Orders Complete By Expires   Diet - low sodium heart healthy  As directed    Discharge instructions  As directed    Comments:     Full comfort to be persued   Increase activity slowly  As directed        Medication List    STOP taking these medications       clobetasol cream 0.05 %   Commonly known as:  TEMOVATE     FIBER SELECT GUMMIES Chew     flintstones complete 60 MG chewable tablet     lisinopril 5 MG tablet  Commonly known as:  PRINIVIL,ZESTRIL     methotrexate 2.5 MG tablet  Commonly known as:  RHEUMATREX     nitrofurantoin (macrocrystal-monohydrate) 100 MG capsule  Commonly known as:  MACROBID     polyethylene glycol packet  Commonly known as:  MIRALAX / GLYCOLAX     PROBIOTIC DAILY PO     promethazine 12.5 MG tablet  Commonly known as:  PHENERGAN     senna-docusate 8.6-50 MG per tablet  Commonly known as:  Senokot-S     tamsulosin 0.4 MG Caps capsule  Commonly known as:  FLOMAX      TAKE these medications       acetaminophen 500 MG tablet  Commonly known as:  TYLENOL  Take 500 mg by mouth every 6 (six) hours as needed. For pain     morphine 2 MG/ML injection  Inject 0.5-1 mLs (1-2 mg total) into the vein every 2 (two) hours as needed (dyspnea).     scopolamine 1 MG/3DAYS  Commonly known as:  TRANSDERM-SCOP  Place 1 patch (1.5 mg total) onto the skin every 3 (three) days.       No Known Allergies    The results of significant diagnostics from this hospitalization (including imaging, microbiology, ancillary and laboratory) are listed below for reference.  Significant Diagnostic Studies: Ct Head Wo Contrast  07/25/2013   CLINICAL DATA:  Right-sided hemi paresis, inability to communicate, history of left-sided frontal hemorrhage, code stroke  EXAM: CT HEAD WITHOUT CONTRAST  TECHNIQUE: Contiguous axial images were obtained from the base of the skull through the vertex without intravenous contrast.  COMPARISON:  MR HEAD WO/W CM dated 06/14/2013; CT HEAD W/O CM dated 06/13/2013  FINDINGS: There is been development of a large at least 7.2 x 3.5 cm intraparenchymal hemorrhage within the right frontal and parietal lobes (representative images 23, series 2). There is an additional approximately 2.6 x 2.7 cm intraparenchymal hemorrhage within  the medial aspect the right frontal lobe (15). These findings are associated with approximately 6 mm of right to left midline shift and effacement of the majority of the right lateral ventricle. There is no definitive extension of these intraparenchymal hemorrhages into the ventricular system or subarachnoid space.  There has been interval involution of previously noted intraparenchymal hemorrhage within the medial aspect of the left frontal lobe with associated residual encephalomalacia (image 20).  Limited visualization of the paranasal sinuses and mastoid air cells are normal. Regional soft tissues are normal. Post bilateral cataract surgery. No displaced calvarial fracture.  IMPRESSION: Interval development of large hemorrhages within the right frontoparietal (measuring at least 7.2 cm) and medial aspect of the right frontal lobes with associated approximately 6 mm of right-to-left midline shift. When compared to prior examinations which demonstrate additional areas of intraparenchymal hemorrhage, in the absence of anti coagulation, the etiology of these multi focal intraparenchymal hemorrhages are concerning for underline amyloidosis.  Critical Value/emergent results were called by telephone at the time of interpretation on 07/25/2013 at 9:29 AM to Dr. Joseph Berkshire , who verbally acknowledged these results.   Electronically Signed   By: Sandi Mariscal M.D.   On: 07/25/2013 09:37   Dg Foot Complete Left  06/27/2013   CLINICAL DATA:  Left foot pain  EXAM: LEFT FOOT - COMPLETE 3+ VIEW  COMPARISON:  None.  FINDINGS: There is a nondisplaced fracture through the distal aspect of the fifth proximal phalanx. No other fracture is seen. A small plantar spur is noted. No soft tissue changes are seen.  IMPRESSION: Undisplaced fifth proximal phalangeal fracture.   Electronically Signed   By: Inez Catalina M.D.   On: 06/27/2013 15:03    Microbiology: No results found for this or any previous visit (from the past 240  hour(s)).   Labs: Basic Metabolic Panel:  Recent Labs Lab 07/25/13 0940  NA 138  K 4.3  CL 100  CO2 27  GLUCOSE 115*  BUN 12  CREATININE 0.82  CALCIUM 9.2   Liver Function Tests: No results found for this basename: AST, ALT, ALKPHOS, BILITOT, PROT, ALBUMIN,  in the last 168 hours No results found for this basename: LIPASE, AMYLASE,  in the last 168 hours No results found for this basename: AMMONIA,  in the last 168 hours CBC:  Recent Labs Lab 07/25/13 0940  WBC 9.6  NEUTROABS 7.8*  HGB 11.5*  HCT 35.5*  MCV 99.7  PLT 303   Cardiac Enzymes: No results found for this basename: CKTOTAL, CKMB, CKMBINDEX, TROPONINI,  in the last 168 hours BNP: BNP (last 3 results) No results found for this basename: PROBNP,  in the last 8760 hours CBG: No results found for this basename: GLUCAP,  in the last 168 hours     Signed:  Nita Sells  [facility] 07/26/2013, 2:01 PM

## 2013-07-26 NOTE — Progress Notes (Signed)
Discharged to [facility], condition stable at transport, out via Biomedical scientist by [place], daughter at side.

## 2013-07-28 LAB — URINE CULTURE

## 2013-08-06 DEATH — deceased

## 2016-01-05 IMAGING — CT CT HEAD W/O CM
1 series · 15 of 30 positions shown, 19 images · non-contrast
Comparison: None.

CLINICAL DATA: Expressive aphasia.

EXAM:
CT HEAD WITHOUT CONTRAST
TECHNIQUE: Contiguous axial images were obtained from the base of the skull
through the vertex without intravenous contrast.

[Series 2: headseq 4.8 h37s · axial · 0.43mm/px · z∈[+59,+219]mm · 15 of 36 slices shown, 19 images]
[im 2/36  brain]
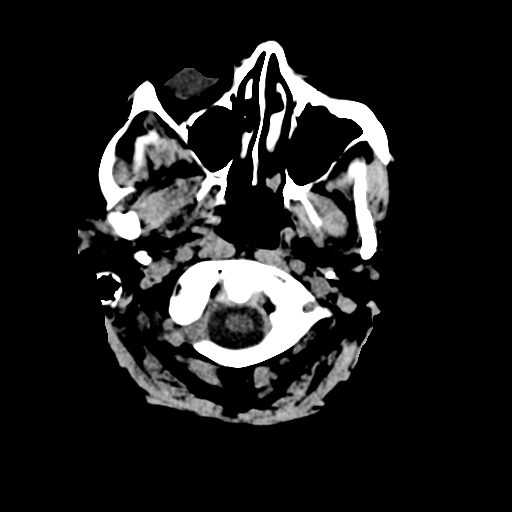
[im 2/36  bone]
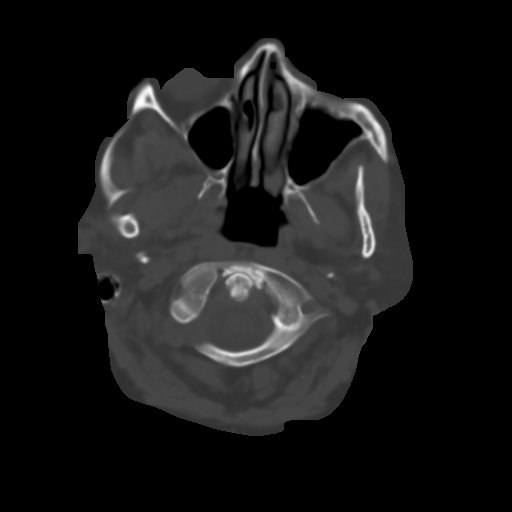
[im 4/36  brain]
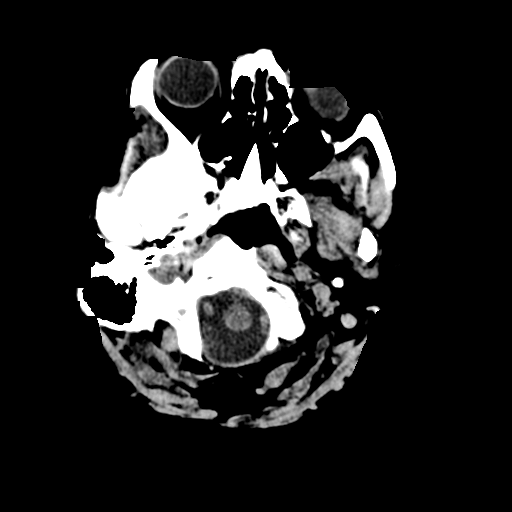
[im 7/36  brain]
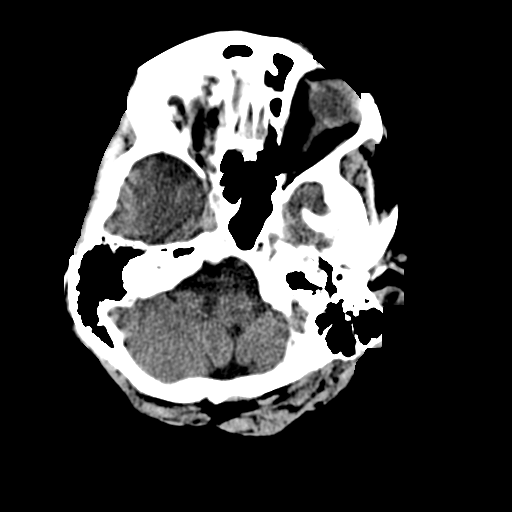
[im 9/36  brain]
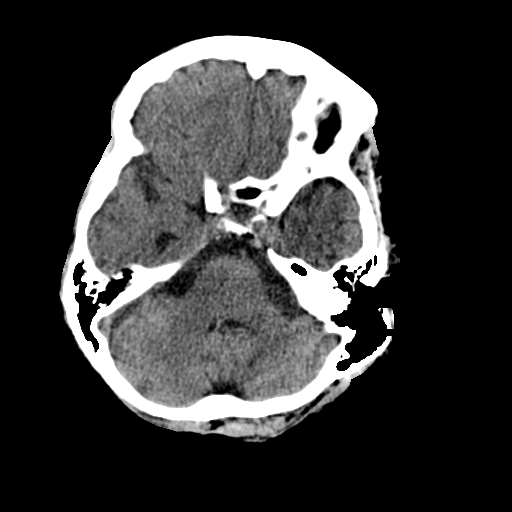
[im 11/36  brain]
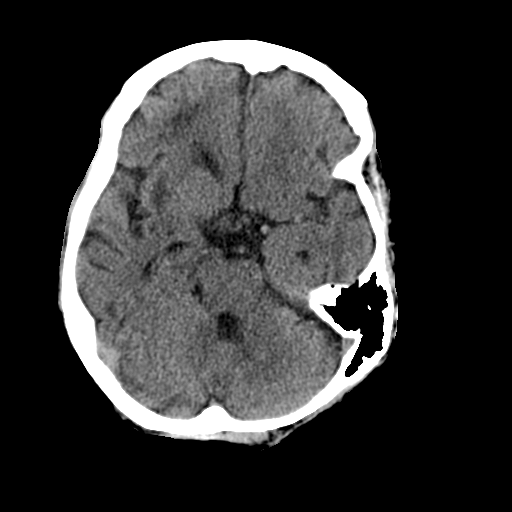
[im 11/36  bone]
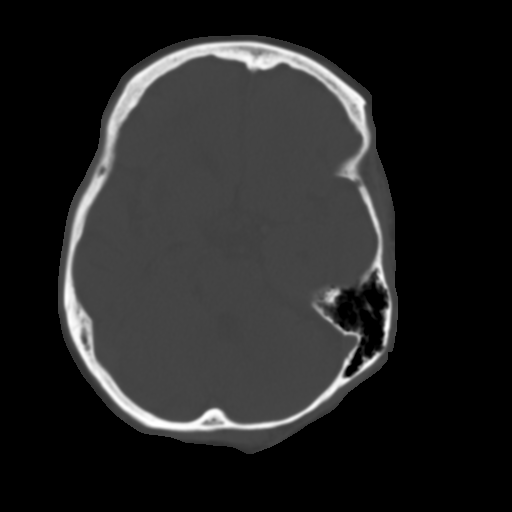
[im 14/36  brain]
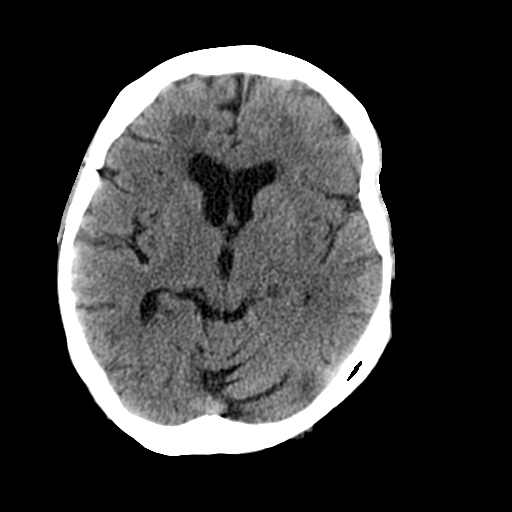
[im 16/36  brain]
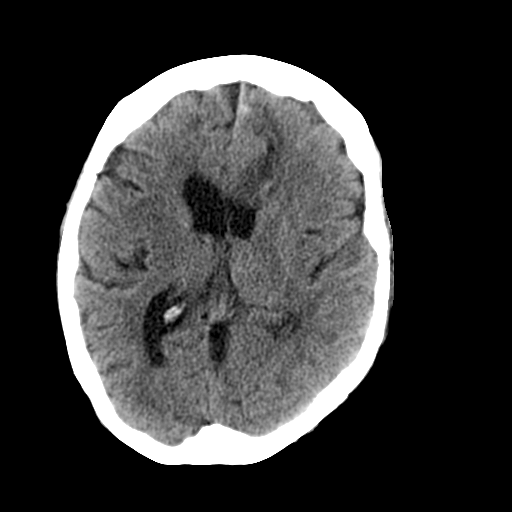
[im 19/36  brain]
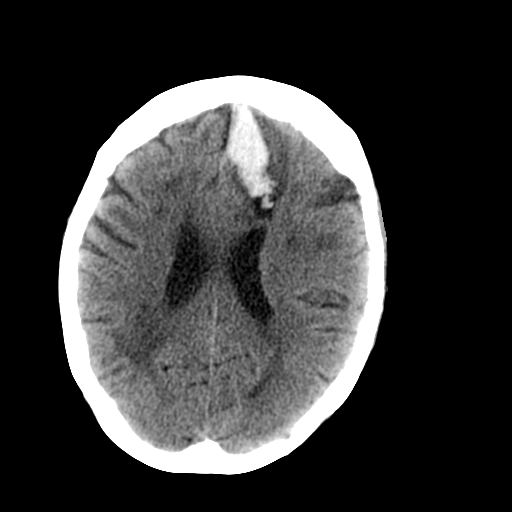
[im 20/36  brain]
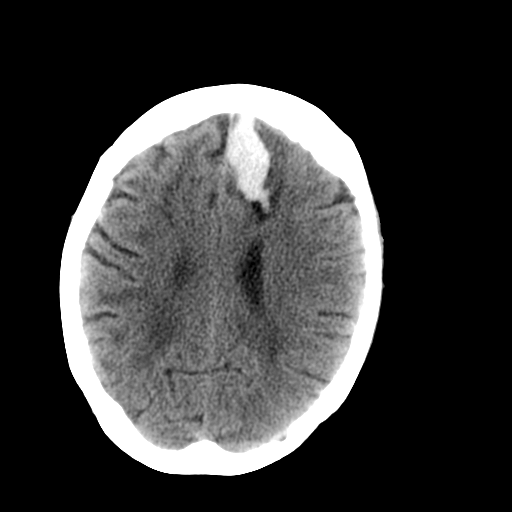
[im 20/36  bone]
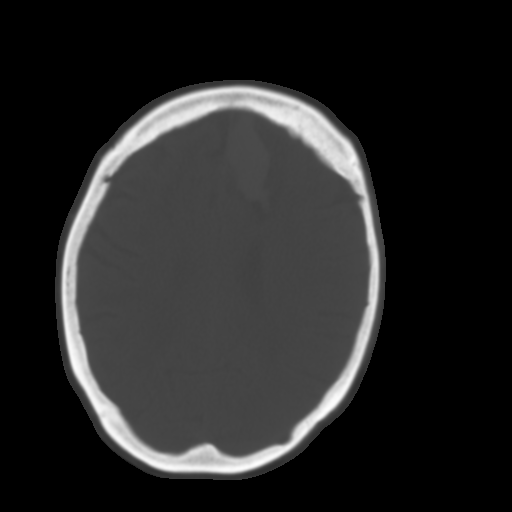
[im 22/36  brain]
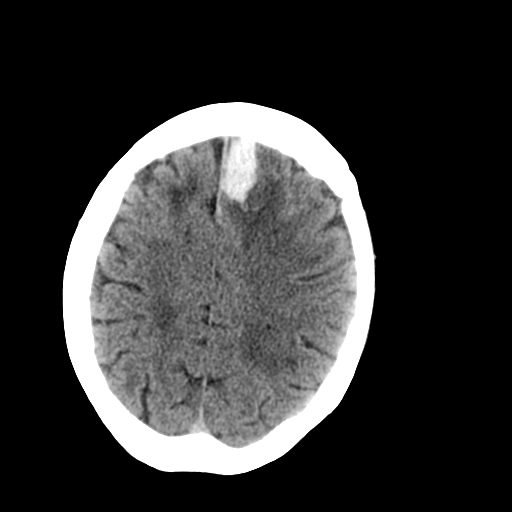
[im 25/36  brain]
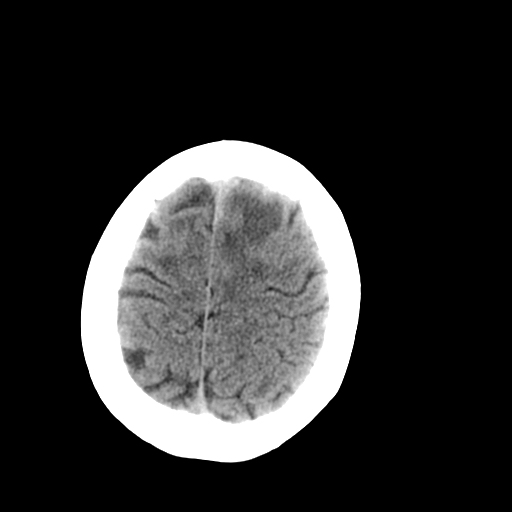
[im 27/36  brain]
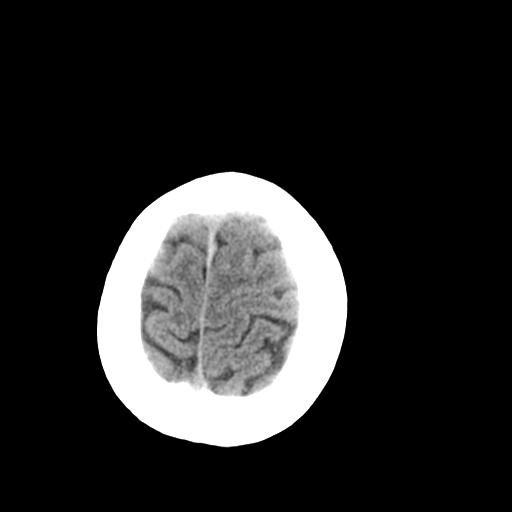
[im 29/36  brain]
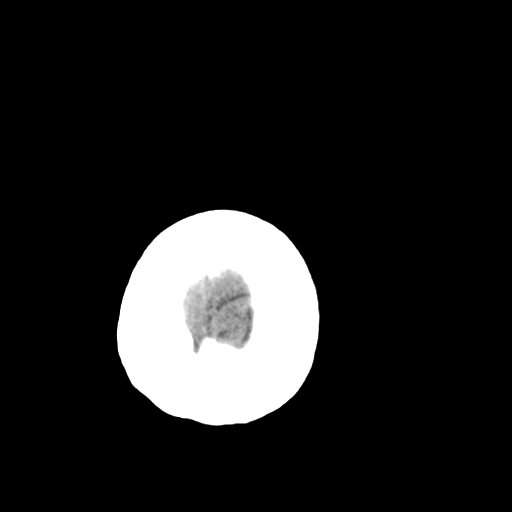
[im 29/36  bone]
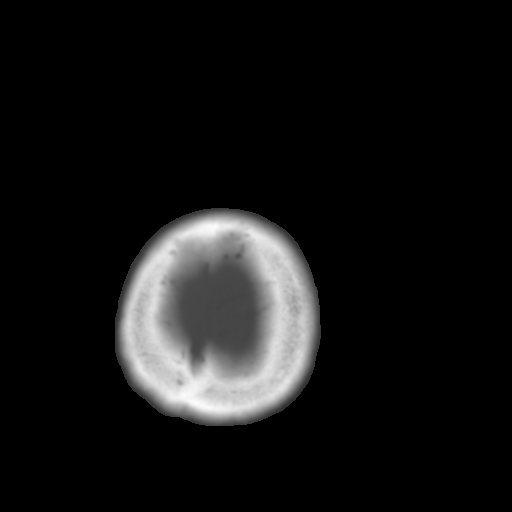
[im 32/36  brain]
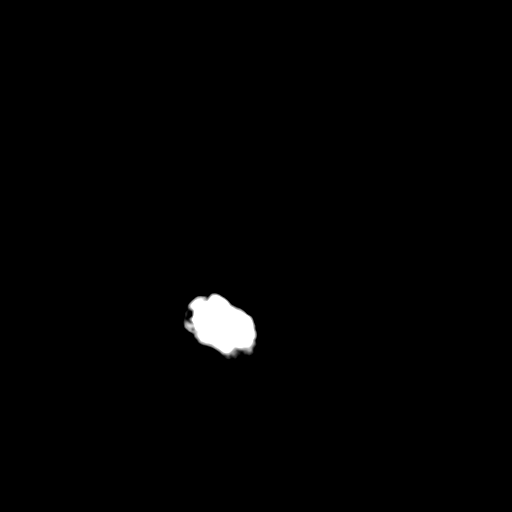
[im 34/36  brain]
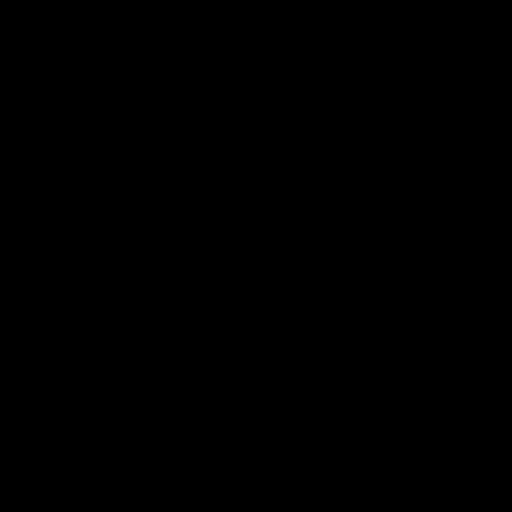

[15 of 30 positions shown; findings below may reference images not displayed]

FINDINGS: There is a left frontal lobe hemorrhage adjacent to the falx
measuring 45 mm x 19 mm. Mass effect is mild with surrounding edema.
Edema may be cytotoxic or vasogenic. There is edema extending into
the corpus callosum. There is underlying atrophy and chronic
ischemic white matter disease. Small amount of hemorrhage breaks
through into the lateral ventricles with blood layering in both
lateral ventricles (image 17 series 2). Posterior fossa structures
appear within normal limits. Intracranial atherosclerosis is mild.
There may also be a tiny amount of breakthrough subdural blood along
the anterior falx (image number 24).

There is no hydrocephalus. No other mass lesions are identified.
Paranasal sinuses appear within normal limits and calvarium is
intact.
IMPRESSION: 1. Left frontal parenchymal hemorrhage adjacent to the falx. This is
favored to represent hemorrhagic infarction however an underlying
neoplasm is difficult to exclude based on the edema and mass effect
extending into the genu of the corpus callosum.
2. Critical Value/emergent results were called by telephone at the
time of interpretation on 06/13/2013 at [DATE] to Dr. Empunkt , who
verbally acknowledged these results.
